# Patient Record
Sex: Male | Born: 1965 | Hispanic: No | Marital: Married | Smoking: Never smoker
Health system: Southern US, Community
[De-identification: ages and names within clinical notes are randomized; demographics above are authoritative.]

## PROBLEM LIST (undated history)

## (undated) DIAGNOSIS — Z972 Presence of dental prosthetic device (complete) (partial): Secondary | ICD-10-CM

## (undated) DIAGNOSIS — G4733 Obstructive sleep apnea (adult) (pediatric): Secondary | ICD-10-CM

## (undated) DIAGNOSIS — N200 Calculus of kidney: Secondary | ICD-10-CM

## (undated) DIAGNOSIS — M1990 Unspecified osteoarthritis, unspecified site: Secondary | ICD-10-CM

## (undated) DIAGNOSIS — G4730 Sleep apnea, unspecified: Secondary | ICD-10-CM

## (undated) DIAGNOSIS — N201 Calculus of ureter: Secondary | ICD-10-CM

## (undated) DIAGNOSIS — R351 Nocturia: Secondary | ICD-10-CM

## (undated) DIAGNOSIS — Z9989 Dependence on other enabling machines and devices: Secondary | ICD-10-CM

## (undated) HISTORY — PX: KNEE ARTHROSCOPY: SUR90

## (undated) HISTORY — PX: WISDOM TOOTH EXTRACTION: SHX21

## (undated) HISTORY — DX: Sleep apnea, unspecified: G47.30

---

## 2010-04-09 ENCOUNTER — Ambulatory Visit: Payer: Self-pay | Admitting: Internal Medicine

## 2010-04-09 DIAGNOSIS — E66811 Obesity, class 1: Secondary | ICD-10-CM | POA: Insufficient documentation

## 2010-04-09 DIAGNOSIS — E669 Obesity, unspecified: Secondary | ICD-10-CM

## 2010-05-30 NOTE — Assessment & Plan Note (Signed)
Summary: NEW PT TO EST/CPX/CLE   Vital Signs:  Patient profile:   45 year old male Height:      69 inches Weight:      192.25 pounds BMI:     28.49 Temp:     98.2 degrees F oral Pulse rate:   76 / minute Pulse rhythm:   regular BP sitting:   110 / 80  (left arm) Cuff size:   regular  Vitals Entered By: Selena Batten Dance CMA Duncan Dull) (April 09, 2010 10:22 AM) CC: New patient to establish care, C-V Risk Management   History of Present Illness: CC: new pt, requests CPE  wife [redacted]wks pregnant.  weight - walks dogs.  has actually lost 30 lbs since moved here from [place] 05/2009.  preventative: flu shot, declines tetanus shot - 1997, Tdap requests today.   health fair at work 01/2010. prostate - nocturia x 1 maybe, strong stream colon - no BM changes, no blood in stool.  Cardiovascular Risk History:      Positive major cardiovascular risk factors include family history for ischemic heart disease (males less than 6 years old).  Negative major cardiovascular risk factors include male age less than 32 years old and non-tobacco-user status.        Further assessment for target organ damage reveals no history of ASHD, cardiac end-organ damage (CHF/LVH), stroke/TIA, peripheral vascular disease, or hypertensive retinopathy.     Preventive Screening-Counseling & Management  Alcohol-Tobacco     Smoking Status: never  Current Medications (verified): 1)  Multivitamins  Tabs (Multiple Vitamin) .... One Daily  Allergies (verified): No Known Drug Allergies  Past History:  Past Medical History: BMI 28  Past Surgical History: L knee arthroscopy 1993  Family History: F: D CAD/MI 43yo   No CA, CVA, DM, HTN  Social History: No smoking, occasional EtOH, no rec drugs caffeine: 1 cup tea/day Occupation: hose fabrication Lives with wife, 2 dogs some collegeSmoking Status:  never  Review of Systems  The patient denies anorexia, fever, weight loss, weight gain, vision loss, decreased  hearing, hoarseness, chest pain, syncope, dyspnea on exertion, peripheral edema, prolonged cough, headaches, hemoptysis, abdominal pain, melena, hematochezia, severe indigestion/heartburn, hematuria, incontinence, muscle weakness, difficulty walking, depression, unusual weight change, and testicular masses.    Physical Exam  General:  Well-developed,well-nourished,in no acute distress; alert,appropriate and cooperative throughout examination Head:  Normocephalic and atraumatic without obvious abnormalities. No apparent alopecia or balding. Eyes:  No corneal or conjunctival inflammation noted. EOMI. Perrla.  Ears:  TMs clear bilaterally Nose:  nares clear bilaterally Mouth:  MMM, no pharyngeal erythema, fair dentition Neck:  No deformities, masses, or tenderness noted.  no LAD, no thyromegaly Lungs:  Normal respiratory effort, chest expands symmetrically. Lungs are clear to auscultation, no crackles or wheezes. Heart:  Normal rate and regular rhythm. S1 and S2 normal without gallop, murmur, click, rub or other extra sounds. Abdomen:  Bowel sounds positive,abdomen soft and non-tender without masses, organomegaly or hernias noted. Msk:  No deformity or scoliosis noted of thoracic or lumbar spine.   Pulses:  2+ rad pulses Extremities:  no pedal edema Neurologic:  CN grossly intact, station and gait intact Skin:  Intact without suspicious lesions or rashes Psych:  full affect, pleasant.   Impression & Recommendations:  Problem # 1:  HEALTH MAINTENANCE EXAM (ICD-V70.0) Tdap today.  Reviewed preventive care protocols, scheduled due services, and updated immunizations.  to bring in recent blood work, will see if Id like to add anything, likely obtain FLP,  CMP.  Problem # 2:  OVERWEIGHT (ICD-278.02) check recent blood work at work, then return for further if indicated.   Ht: 69 (04/09/2010)   Wt: 192.25 (04/09/2010)   BMI: 28.49 (04/09/2010)  Complete Medication List: 1)  Multivitamins Tabs  (Multiple vitamin) .... One daily  Other Orders: Tdap => 13yrs IM (46962) Admin 1st Vaccine (95284)  Cardiovascular Risk Assessment/Plan:      The patient's hypertensive risk group is category B: At least one risk factor (excluding diabetes) with no target organ damage.  Today's blood pressure is 110/80.    Patient Instructions: 1)  Tdap today. 2)  Return in 1 year for next physical or as needed. 3)  Bring copy of recent blood work. 4)  Good to meet you today, call clinic with questions.   Orders Added: 1)  Tdap => 24yrs IM [90715] 2)  Admin 1st Vaccine [[id number]] 3)  New Patient 40-64 years [99386]   Immunizations Administered:  Tetanus Vaccine:    Vaccine Type: Tdap    Site: left deltoid    Mfr: GlaxoSmithKline    Dose: 0.5 ml    Route: IM    Given by: Selena Batten Dance CMA (AAMA)    Exp. Date: 02/15/2012    Lot #: [id number]    VIS given: 03/15/08 version given April 09, 2010.   Immunizations Administered:  Tetanus Vaccine:    Vaccine Type: Tdap    Site: left deltoid    Mfr: GlaxoSmithKline    Dose: 0.5 ml    Route: IM    Given by: Selena Batten Dance CMA (AAMA)    Exp. Date: 02/15/2012    Lot #: [id number]    VIS given: 03/15/08 version given April 09, 2010.  Prior Medications: Current Allergies (reviewed today): No known allergies

## 2011-05-27 ENCOUNTER — Ambulatory Visit (INDEPENDENT_AMBULATORY_CARE_PROVIDER_SITE_OTHER): Payer: BC Managed Care – PPO | Admitting: Family Medicine

## 2011-05-27 ENCOUNTER — Encounter: Payer: Self-pay | Admitting: Family Medicine

## 2011-05-27 VITALS — BP 110/80 | HR 80 | Temp 97.9°F | Ht 69.0 in | Wt 221.8 lb

## 2011-05-27 DIAGNOSIS — Z0000 Encounter for general adult medical examination without abnormal findings: Secondary | ICD-10-CM | POA: Insufficient documentation

## 2011-05-27 DIAGNOSIS — R21 Rash and other nonspecific skin eruption: Secondary | ICD-10-CM

## 2011-05-27 DIAGNOSIS — Z23 Encounter for immunization: Secondary | ICD-10-CM

## 2011-05-27 LAB — LIPID PANEL
HDL: 49.8 mg/dL (ref >39.00)
LDL Cholesterol: 125 mg/dL — ABNORMAL HIGH (ref 0–99)
Total CHOL/HDL Ratio: 4
Triglycerides: 87 mg/dL (ref 0.0–149.0)

## 2011-05-27 LAB — COMPREHENSIVE METABOLIC PANEL
ALT: 22 U/L (ref 0–53)
BUN: 20 mg/dL (ref 6–23)
CO2: 25 mEq/L (ref 19–32)
Creatinine, Ser: 0.8 mg/dL (ref 0.4–1.5)
GFR: 118.74 mL/min (ref >60.00)
Total Bilirubin: 0.7 mg/dL (ref 0.3–1.2)

## 2011-05-27 NOTE — Progress Notes (Signed)
Subjective:    Patient ID: Dwayne Delgado, male    DOB: 07/15/1965, 46 y.o.   MRN: [id number]  HPI CC: CPE today  Fasting, would like blood work.  Rash at work - bilateral forearms.  Itchy, wonders if due to dust.    Preventative: Blood work today flu shot, would like today. tetanus shot - 03/2010 prostate - nocturia x 1 maybe, strong stream colon - no BM changes, no blood in stool Seat belt 100% Sunscreen use discussed  1 cup tea/daily Hose fabrication, 3rd shift Lives with wife, 6 mo baby, 2 dogs Some college Activity: walks with dogs Diet: good water, daily fruits/vegetables, 2-3x/wk red meat, fish seldom  Medications and allergies reviewed and updated in chart.  Past histories reviewed and updated if relevant as below. Patient Active Problem List  Diagnoses  . OVERWEIGHT   Past Medical History  Diagnosis Date  . Overweight    Past Surgical History  Procedure Date  . Knee arthroscopy 1993    Left   History  Substance Use Topics  . Smoking status: Never Smoker   . Smokeless tobacco: Never Used  . Alcohol Use: Yes     Very rare   Family History  Problem Relation Age of Onset  . Coronary artery disease Father   . Heart attack Father 33  . Cancer Neg Hx   . Stroke Neg Hx   . Diabetes Neg Hx   . Hypertension Neg Hx    No Known Allergies No current outpatient prescriptions on file prior to visit.     Review of Systems  Constitutional: Negative for fever, chills, activity change, appetite change, fatigue and unexpected weight change.  HENT: Negative for hearing loss and neck pain.   Eyes: Positive for visual disturbance (to see optometrist today).  Respiratory: Negative for cough, chest tightness, shortness of breath and wheezing.   Cardiovascular: Negative for chest pain, palpitations and leg swelling.  Gastrointestinal: Negative for nausea, vomiting, abdominal pain, diarrhea, constipation, blood in stool and abdominal distention.  Genitourinary:  Negative for hematuria and difficulty urinating.  Musculoskeletal: Negative for myalgias and arthralgias.  Skin: Negative for rash.  Neurological: Negative for dizziness, seizures, syncope and headaches.  Hematological: Does not bruise/bleed easily.  Psychiatric/Behavioral: Negative for dysphoric mood. The patient is not nervous/anxious.        Objective:   Physical Exam  Nursing note and vitals reviewed. Constitutional: He is oriented to person, place, and time. He appears well-developed and well-nourished. No distress.  HENT:  Head: Normocephalic and atraumatic.  Right Ear: External ear normal.  Left Ear: External ear normal.  Nose: Nose normal.  Mouth/Throat: Oropharynx is clear and moist. No oropharyngeal exudate.  Eyes: Conjunctivae and EOM are normal. Pupils are equal, round, and reactive to light. No scleral icterus.  Neck: Normal range of motion. Neck supple. No thyromegaly present.  Cardiovascular: Normal rate, regular rhythm, normal heart sounds and intact distal pulses.   No murmur Delgado. Pulses:      Radial pulses are 2+ on the right side, and 2+ on the left side.  Pulmonary/Chest: Effort normal and breath sounds normal. No respiratory distress. He has no wheezes. He has no rales.  Abdominal: Soft. Bowel sounds are normal. He exhibits no distension and no mass. There is no tenderness. There is no rebound and no guarding.  Musculoskeletal: Normal range of motion.  Lymphadenopathy:    He has no cervical adenopathy.  Neurological: He is alert and oriented to person, place, and time.  CN grossly intact, station and gait intact  Skin: Skin is warm and dry. Rash noted.       Mild papular rash anterior distal forearms  Psychiatric: He has a normal mood and affect. His behavior is normal. Judgment and thought content normal.       Assessment & Plan:

## 2011-05-27 NOTE — Patient Instructions (Signed)
Flu shot today. Blood work today. For skin rash - looks like irritant dermatitis from some exposure at work.  Consider longer gloves, may try hydrocortisone cream over the counter or anti itch cream over the counter.  Let us know if worsening. Good to see you today, call us with quesitons. Return as needed or in 1 year for another physical.

## 2011-05-27 NOTE — Assessment & Plan Note (Signed)
Anticipate irritant dermatitis. See pt instructions. May try OTC hydrocortisone cream, advised limit use to 1wk at a time.

## 2011-05-27 NOTE — Assessment & Plan Note (Signed)
Preventative protocols reviewed and updated unless pt declined. Flu shot today. Discussed healthy diet and lifestyle.

## 2014-02-10 ENCOUNTER — Ambulatory Visit (INDEPENDENT_AMBULATORY_CARE_PROVIDER_SITE_OTHER): Payer: BC Managed Care – PPO

## 2014-02-10 DIAGNOSIS — Z23 Encounter for immunization: Secondary | ICD-10-CM

## 2014-06-13 ENCOUNTER — Ambulatory Visit (INDEPENDENT_AMBULATORY_CARE_PROVIDER_SITE_OTHER)
Admission: RE | Admit: 2014-06-13 | Discharge: 2014-06-13 | Disposition: A | Discharge status: Home / Self Care | Payer: BLUE CROSS/BLUE SHIELD | Source: Ambulatory Visit | Attending: Family Medicine | Admitting: Family Medicine

## 2014-06-13 ENCOUNTER — Encounter: Payer: Self-pay | Admitting: Family Medicine

## 2014-06-13 ENCOUNTER — Ambulatory Visit (INDEPENDENT_AMBULATORY_CARE_PROVIDER_SITE_OTHER): Payer: BLUE CROSS/BLUE SHIELD | Admitting: Family Medicine

## 2014-06-13 VITALS — BP 138/84 | HR 104 | Temp 98.2°F | Ht 68.5 in | Wt 219.5 lb

## 2014-06-13 DIAGNOSIS — J181 Lobar pneumonia, unspecified organism: Principal | ICD-10-CM

## 2014-06-13 DIAGNOSIS — R351 Nocturia: Secondary | ICD-10-CM | POA: Insufficient documentation

## 2014-06-13 DIAGNOSIS — J189 Pneumonia, unspecified organism: Secondary | ICD-10-CM | POA: Insufficient documentation

## 2014-06-13 DIAGNOSIS — E669 Obesity, unspecified: Secondary | ICD-10-CM

## 2014-06-13 MED ORDER — TAMSULOSIN HCL 0.4 MG PO CAPS: 0.4000 mg | ORAL_CAPSULE | Freq: Every day | ORAL | Status: DC

## 2014-06-13 MED ORDER — AZITHROMYCIN 250 MG PO TABS: ORAL_TABLET | ORAL | Status: DC

## 2014-06-13 MED ORDER — CEFTRIAXONE SODIUM 1 G IJ SOLR
1.0000 g | Freq: Once | INTRAMUSCULAR | Status: AC
  Administered 2014-06-13: 1 g via INTRAMUSCULAR

## 2014-06-13 MED ORDER — ONDANSETRON HCL 4 MG PO TABS: 4.0000 mg | ORAL_TABLET | Freq: Three times a day (TID) | ORAL | Status: DC | PRN

## 2014-06-13 NOTE — Assessment & Plan Note (Signed)
Anticipate BPH related - will start flomax 0.4mg  daily today, discussed need to monitor for hypotension with med.  Pt agrees with plan.  Check UA next visit.

## 2014-06-13 NOTE — Addendum Note (Signed)
Addended by: Royann Shivers A on: 06/13/2014 04:30 PM   Modules accepted: Orders

## 2014-06-13 NOTE — Assessment & Plan Note (Signed)
Check labs today - review next visit.

## 2014-06-13 NOTE — Progress Notes (Signed)
BP 138/84 mmHg  Pulse 104  Temp(Src) 98.2 F (36.8 C) (Oral)  Ht 5' 8.5" (1.74 m)  Wt 219 lb 8 oz (99.565 kg)  BMI 32.89 kg/m2  SpO2 93%   CC: CPE converted to acute visit Subjective:    Patient ID: Dwayne Delgado, male    DOB: February 26, 1966, 49 y.o.   MRN: [id number]  HPI: Dwayne Delgado is a 49 y.o. male presenting on 06/13/2014 for Annual Exam   Last seen here 05/27/2011. Scheduled for CPE but converted to sick visit.  Cough present for last 2 weeks, to point of gagging. Cough productive of green mucous. Occasional chills and dyspnea and headaches with cough.  Last week with fever to 101, none since. Nausea/vomiting over last 3-4 days., unable to keep food down.  13 lbs down in last week. No fevers, ear or tooth pain, abd pain, ST or PNDrainage. Staying well hydrated with plenty of water. So far tried mucinex and tylenol OTC, and robitussin. No sick contacts at home. No smokers at home.   [facility]. Started on allopurinol 100mg  daily. Waiting for refill.   Noticing increased nocturia 5-7x, ongoing for last 6 months. Still strong stream, without hesitancy or dribbling, no dysuria, urgency or hematuria.   Fasting today.  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 Interim medical history since our last visit reviewed. Allergies and medications reviewed and updated. Current Outpatient Prescriptions on File Prior to Visit  Medication Sig  . Multiple Vitamins-Minerals (MULTIVITAMIN PO) Take 1  tablet by mouth daily.   No current facility-administered medications on file prior to visit.    Review of Systems  Constitutional: Positive for appetite change and unexpected weight change. Negative for fever, chills, activity change and fatigue.  HENT: Negative for hearing loss.   Eyes: Negative for visual disturbance.  Respiratory: Positive for cough and shortness of breath. Negative for chest tightness and wheezing.   Cardiovascular: Negative for chest pain, palpitations and leg swelling.  Gastrointestinal: Positive for nausea, vomiting and abdominal pain. Negative for diarrhea, constipation, blood in stool and abdominal distention.  Genitourinary: Negative for hematuria and difficulty urinating.  Musculoskeletal: Negative for myalgias, arthralgias and neck pain.  Skin: Negative for rash.  Neurological: Positive for headaches. Negative for dizziness, seizures and syncope.  Hematological: Negative for adenopathy. Does not bruise/bleed easily.  Psychiatric/Behavioral: Negative for dysphoric mood. The patient is not nervous/anxious.    Per HPI unless specifically indicated above     Objective:    BP 138/84 mmHg  Pulse 104  Temp(Src) 98.2 F (36.8 C) (Oral)  Ht 5' 8.5" (1.74 m)  Wt 219 lb 8 oz (99.565 kg)  BMI 32.89 kg/m2  SpO2 93%  Wt Readings from Last 3 Encounters:  06/13/14 219 lb 8 oz (99.565 kg)  05/27/11 221 lb 12 oz (100.585 kg)  04/09/10 192 lb 4 oz (87.204 kg)    Physical Exam  Constitutional: He is oriented to person, place, and time. He appears well-developed. No distress.  Tired, ill appearing, nontoxic  HENT:  Head: Normocephalic and atraumatic.  Right Ear: Hearing, tympanic membrane, external ear and ear canal normal.  Left Ear: Hearing, tympanic membrane, external ear and ear canal normal.  Nose: Nose normal.  Mouth/Throat: Uvula is midline, oropharynx is clear and moist and mucous membranes are normal. No oropharyngeal exudate, posterior oropharyngeal  edema or posterior oropharyngeal erythema.  Eyes: Conjunctivae and EOM are normal. Pupils are equal, round, and reactive to light. No scleral icterus.  Neck: Normal range of motion. Neck supple. No thyromegaly present.  Cardiovascular: Normal rate, regular rhythm, normal heart sounds and intact distal pulses.   No murmur heard. Pulses:      Radial pulses are 2+ on the right side, and 2+ on the left side.  Pulmonary/Chest: Effort normal. No respiratory distress. He has decreased breath sounds (RLL and LLL). He has no wheezes. He has no rhonchi. He has rales (RLL > LLL).  Abdominal: Soft. Bowel sounds are normal. He exhibits no distension and no mass. There is no tenderness. There is no rebound and no guarding.  Genitourinary: Rectum normal. Rectal exam shows no external hemorrhoid, no internal hemorrhoid, no fissure, no mass, no tenderness and anal tone normal. Prostate is enlarged (20-25gm). Prostate is not tender.  Musculoskeletal: Normal range of motion. He exhibits no edema.  Lymphadenopathy:    He has no cervical adenopathy.  Neurological: He is alert and oriented to person, place, and time.  CN grossly intact, station and gait intact  Skin: Skin is warm and dry. No rash noted.  Psychiatric: He has a normal mood and affect. His behavior is normal. Judgment and thought content normal.  Nursing note and vitals reviewed.      Assessment & Plan:   Problem List Items Addressed This Visit    RLL pneumonia - Primary    Clinical RLL PNA but LLL infiltrate on xray on my read. Await radiology read. Treat with 1gm CTX IM today as well as zpack sent to pharmacy. zofran prn nausea.  Update or return to clinic if any worsening/not improving as expected otherwise.  Pt agrees with plan.      Relevant Medications   azithromycin (ZITHROMAX) tablet   Other Relevant Orders   DG Chest 2 View   CBC with Differential/Platelet   Obesity    Check labs today - review next visit.      Relevant  Orders   Comprehensive metabolic panel   Lipid panel   Nocturia    Anticipate BPH related - will start flomax 0.4mg  daily today, discussed need to monitor for hypotension with med.  Pt agrees with plan.  Check UA next visit.       Relevant Orders   PSA       Follow up plan: Return in about 4 weeks (around 07/11/2014), or as needed, for annual exam, prior fasting for blood work.

## 2014-06-13 NOTE — Assessment & Plan Note (Signed)
Clinical RLL PNA but LLL infiltrate on xray on my read. Await radiology read. Treat with 1gm CTX IM today as well as zpack sent to pharmacy. zofran prn nausea.  Update or return to clinic if any worsening/not improving as expected otherwise.  Pt agrees with plan.

## 2014-06-13 NOTE — Progress Notes (Signed)
Pre visit review using our clinic review tool, if applicable. No additional management support is needed unless otherwise documented below in the visit note. 

## 2014-06-13 NOTE — Patient Instructions (Addendum)
I think you have pneumonia - xray today. Shot of antibiotic today. Then start azithromycin sent to pharmacy. Push fluids and rest. For prostate - try flomax once daily to improve voiding. Watch for dizziness or low blood pressure with this medicine. Return in 1 month for annual physical. Labwork today and we will call you with results.

## 2014-06-14 LAB — LIPID PANEL
CHOL/HDL RATIO: 5
Cholesterol: 166 mg/dL (ref 0–200)
HDL: 34.1 mg/dL — AB (ref >39.00)
LDL Cholesterol: 118 mg/dL — ABNORMAL HIGH (ref 0–99)
NONHDL: 131.9
Triglycerides: 70 mg/dL (ref 0.0–149.0)
VLDL: 14 mg/dL (ref 0.0–40.0)

## 2014-06-14 LAB — COMPREHENSIVE METABOLIC PANEL
ALT: 28 U/L (ref 0–53)
AST: 27 U/L (ref 0–37)
Albumin: 4 g/dL (ref 3.5–5.2)
Alkaline Phosphatase: 89 U/L (ref 39–117)
BILIRUBIN TOTAL: 0.6 mg/dL (ref 0.2–1.2)
BUN: 16 mg/dL (ref 6–23)
CALCIUM: 10 mg/dL (ref 8.4–10.5)
CHLORIDE: 99 meq/L (ref 96–112)
CO2: 26 meq/L (ref 19–32)
CREATININE: 0.92 mg/dL (ref 0.40–1.50)
GFR: 93.94 mL/min (ref >60.00)
Glucose, Bld: 95 mg/dL (ref 70–99)
Potassium: 3.9 mEq/L (ref 3.5–5.1)
Sodium: 138 mEq/L (ref 135–145)
Total Protein: 7.4 g/dL (ref 6.0–8.3)

## 2014-06-14 LAB — CBC WITH DIFFERENTIAL/PLATELET
Basophils Absolute: 0.1 10*3/uL (ref 0.0–0.1)
Basophils Relative: 0.4 % (ref 0.0–3.0)
EOS PCT: 0.2 % (ref 0.0–5.0)
Eosinophils Absolute: 0 10*3/uL (ref 0.0–0.7)
HCT: 43.2 % (ref 39.0–52.0)
Hemoglobin: 14.4 g/dL (ref 13.0–17.0)
LYMPHS PCT: 13.6 % (ref 12.0–46.0)
Lymphs Abs: 2.2 10*3/uL (ref 0.7–4.0)
MCHC: 33.2 g/dL (ref 30.0–36.0)
MCV: 81.6 fl (ref 78.0–100.0)
MONO ABS: 0.7 10*3/uL (ref 0.1–1.0)
Monocytes Relative: 4.3 % (ref 3.0–12.0)
NEUTROS PCT: 81.5 % — AB (ref 43.0–77.0)
Neutro Abs: 13.3 10*3/uL — ABNORMAL HIGH (ref 1.4–7.7)
PLATELETS: 594 10*3/uL — AB (ref 150.0–400.0)
RBC: 5.29 Mil/uL (ref 4.22–5.81)
RDW: 13.1 % (ref 11.5–15.5)
WBC: 16.4 10*3/uL — AB (ref 4.0–10.5)

## 2014-06-14 LAB — PSA: PSA: 0.45 ng/mL (ref 0.10–4.00)

## 2014-06-16 ENCOUNTER — Encounter: Payer: Self-pay | Admitting: *Deleted

## 2014-07-10 ENCOUNTER — Other Ambulatory Visit: Payer: Self-pay | Admitting: *Deleted

## 2014-07-10 MED ORDER — TAMSULOSIN HCL 0.4 MG PO CAPS: 0.4000 mg | ORAL_CAPSULE | Freq: Every day | ORAL | Status: DC

## 2014-07-14 ENCOUNTER — Encounter: Payer: BLUE CROSS/BLUE SHIELD | Admitting: Family Medicine

## 2014-10-13 ENCOUNTER — Ambulatory Visit (INDEPENDENT_AMBULATORY_CARE_PROVIDER_SITE_OTHER): Payer: BLUE CROSS/BLUE SHIELD | Admitting: Family Medicine

## 2014-10-13 ENCOUNTER — Encounter: Payer: Self-pay | Admitting: Family Medicine

## 2014-10-13 VITALS — BP 126/74 | HR 82 | Temp 97.8°F | Ht 68.0 in | Wt 216.8 lb

## 2014-10-13 DIAGNOSIS — R351 Nocturia: Secondary | ICD-10-CM

## 2014-10-13 DIAGNOSIS — Z0000 Encounter for general adult medical examination without abnormal findings: Secondary | ICD-10-CM

## 2014-10-13 DIAGNOSIS — E669 Obesity, unspecified: Secondary | ICD-10-CM

## 2014-10-13 LAB — POCT URINALYSIS DIPSTICK
BILIRUBIN UA: NEGATIVE
Blood, UA: NEGATIVE
GLUCOSE UA: NEGATIVE
Ketones, UA: NEGATIVE
Leukocytes, UA: NEGATIVE
NITRITE UA: NEGATIVE
PH UA: 6
Protein, UA: NEGATIVE
SPEC GRAV UA: 1.025
UROBILINOGEN UA: 0.2

## 2014-10-13 MED ORDER — DOXAZOSIN MESYLATE 2 MG PO TABS: 2.0000 mg | ORAL_TABLET | Freq: Every day | ORAL | Status: DC

## 2014-10-13 NOTE — Assessment & Plan Note (Signed)
DRE last year with mildly enlarged prostate, but did not respond well to flomax. Will check UA today and trial cardura. If fails this, try oxybutynin IR nightly.

## 2014-10-13 NOTE — Assessment & Plan Note (Signed)
Preventative protocols reviewed and updated unless pt declined. Discussed healthy diet and lifestyle.  

## 2014-10-13 NOTE — Patient Instructions (Addendum)
You are doing well today. Good to see you, call us with questions. Check urine today. If normal we will send in different prostate medicine to help with night time voiding. Return as needed or in 1 year for next physical.

## 2014-10-13 NOTE — Progress Notes (Signed)
BP 126/74 mmHg  Pulse 82  Temp(Src) 97.8 F (36.6 C) (Oral)  Ht 5\' 8"  (1.727 m)  Wt 216 lb 12.8 oz (98.34 kg)  BMI 32.97 kg/m2  SpO2 95%   CC: CPE  Subjective:    Patient ID: Dwayne Delgado, male    DOB: 1966/03/08, 49 y.o.   MRN: [id number]  HPI: Dwayne Delgado is a 49 y.o. male presenting on 10/13/2014 for Annual Exam   Nocturia x3-6 nightly. Flomax didn't help. No dysuria or UTI sxs.   Preventative: Prostate - nocturia x 3-6, strong stream Colon - no BM changes, no blood in stool flu shot 01/2014 tetanus shot - 03/2010 Seat belt 100% Sunscreen use discussed, no changing moles  1 cup tea/daily Lives with wife, 2 children, 2 dogs Occ: Hose fabrication, 1st shift Edu: Some college Activity: walks with dogs and walks regularly at work Diet: good water, daily fruits/vegetables  Relevant past medical, surgical, family and social history reviewed and updated as indicated. Interim medical history since our last visit reviewed. Allergies and medications reviewed and updated. Current Outpatient Prescriptions on File Prior to Visit  Medication Sig  . Multiple Vitamins-Minerals (MULTIVITAMIN PO) Take 1 tablet by mouth daily.   No current facility-administered medications on file prior to visit.    Review of Systems  Constitutional: Negative for fever, chills, activity change, appetite change, fatigue and unexpected weight change.  HENT: Negative for hearing loss.   Eyes: Negative for visual disturbance.  Respiratory: Negative for cough, chest tightness, shortness of breath and wheezing.   Cardiovascular: Negative for chest pain, palpitations and leg swelling.  Gastrointestinal: Negative for nausea, vomiting, abdominal pain, diarrhea, constipation, blood in stool and abdominal distention.  Genitourinary: Negative for hematuria and difficulty urinating.  Musculoskeletal: Positive for joint swelling (knees) and arthralgias (knees). Negative for myalgias and neck pain.  Skin:  Negative for rash.  Neurological: Negative for dizziness, seizures, syncope and headaches.  Hematological: Negative for adenopathy. Does not bruise/bleed easily.  Psychiatric/Behavioral: Negative for dysphoric mood. The patient is not nervous/anxious.    Per HPI unless specifically indicated above     Objective:    BP 126/74 mmHg  Pulse 82  Temp(Src) 97.8 F (36.6 C) (Oral)  Ht 5\' 8"  (1.727 m)  Wt 216 lb 12.8 oz (98.34 kg)  BMI 32.97 kg/m2  SpO2 95%  Wt Readings from Last 3 Encounters:  10/13/14 216 lb 12.8 oz (98.34 kg)  06/13/14 219 lb 8 oz (99.565 kg)  05/27/11 221 lb 12 oz (100.585 kg)    Physical Exam  Constitutional: He is oriented to person, place, and time. He appears well-developed and well-nourished. No distress.  HENT:  Head: Normocephalic and atraumatic.  Right Ear: Hearing, tympanic membrane, external ear and ear canal normal.  Left Ear: Hearing, tympanic membrane, external ear and ear canal normal.  Nose: Nose normal.  Mouth/Throat: Uvula is midline, oropharynx is clear and moist and mucous membranes are normal. No oropharyngeal exudate, posterior oropharyngeal edema or posterior oropharyngeal erythema.  Eyes: Conjunctivae and EOM are normal. Pupils are equal, round, and reactive to light. No scleral icterus.  Neck: Normal range of motion. Neck supple. No thyromegaly present.  Cardiovascular: Normal rate, regular rhythm, normal heart sounds and intact distal pulses.   No murmur heard. Pulses:      Radial pulses are 2+ on the right side, and 2+ on the left side.  Pulmonary/Chest: Effort normal and breath sounds normal. No respiratory distress. He has no wheezes. He has no rales.  Abdominal: Soft. Bowel sounds are normal. He exhibits no distension and no mass. There is no tenderness. There is no rebound and no guarding.  Genitourinary:  DRE - deferred - done 05/2014.  Musculoskeletal: Normal range of motion. He exhibits no edema.  Lymphadenopathy:    He has no  cervical adenopathy.  Neurological: He is alert and oriented to person, place, and time.  CN grossly intact, station and gait intact  Skin: Skin is warm and dry. No rash noted.  Psychiatric: He has a normal mood and affect. His behavior is normal. Judgment and thought content normal.  Nursing note and vitals reviewed.  Results for orders placed or performed in visit on 06/13/14  PSA  Result Value Ref Range   PSA 0.45 0.10 - 4.00 ng/mL  CBC with Differential/Platelet  Result Value Ref Range   WBC 16.4 (H) 4.0 - 10.5 K/uL   RBC 5.29 4.22 - 5.81 Mil/uL   Hemoglobin 14.4 13.0 - 17.0 g/dL   HCT 43.2 39.0 - 52.0 %   MCV 81.6 78.0 - 100.0 fl   MCHC 33.2 30.0 - 36.0 g/dL   RDW 13.1 11.5 - 15.5 %   Platelets 594.0 (H) 150.0 - 400.0 K/uL   Neutrophils Relative % 81.5 (H) 43.0 - 77.0 %   Lymphocytes Relative 13.6 12.0 - 46.0 %   Monocytes Relative 4.3 3.0 - 12.0 %   Eosinophils Relative 0.2 0.0 - 5.0 %   Basophils Relative 0.4 0.0 - 3.0 %   Neutro Abs 13.3 (H) 1.4 - 7.7 K/uL   Lymphs Abs 2.2 0.7 - 4.0 K/uL   Monocytes Absolute 0.7 0.1 - 1.0 K/uL   Eosinophils Absolute 0.0 0.0 - 0.7 K/uL   Basophils Absolute 0.1 0.0 - 0.1 K/uL  Comprehensive metabolic panel  Result Value Ref Range   Sodium 138 135 - 145 mEq/L   Potassium 3.9 3.5 - 5.1 mEq/L   Chloride 99 96 - 112 mEq/L   CO2 26 19 - 32 mEq/L   Glucose, Bld 95 70 - 99 mg/dL   BUN 16 6 - 23 mg/dL   Creatinine, Ser 0.92 0.40 - 1.50 mg/dL   Total Bilirubin 0.6 0.2 - 1.2 mg/dL   Alkaline Phosphatase 89 39 - 117 U/L   AST 27 0 - 37 U/L   ALT 28 0 - 53 U/L   Total Protein 7.4 6.0 - 8.3 g/dL   Albumin 4.0 3.5 - 5.2 g/dL   Calcium 10.0 8.4 - 10.5 mg/dL   GFR 93.94 >60.00 mL/min  Lipid panel  Result Value Ref Range   Cholesterol 166 0 - 200 mg/dL   Triglycerides 70.0 0.0 - 149.0 mg/dL   HDL 34.10 (L) >39.00 mg/dL   VLDL 14.0 0.0 - 40.0 mg/dL   LDL Cholesterol 118 (H) 0 - 99 mg/dL   Total CHOL/HDL Ratio 5    NonHDL 131.90         Assessment & Plan:   Problem List Items Addressed This Visit    Healthcare maintenance - Primary    Preventative protocols reviewed and updated unless pt declined. Discussed healthy diet and lifestyle.       Nocturia    DRE last year with mildly enlarged prostate, but did not respond well to flomax. Will check UA today and trial cardura. If fails this, try oxybutynin IR nightly.      Obesity, Class I, BMI 30-34.9    Discussed healthy diet and lifestyle changes to affect sustainable weight loss.  Follow up plan: Return in about 1 year (around 10/13/2015), or as needed, for annual exam, prior fasting for blood work.

## 2014-10-13 NOTE — Progress Notes (Signed)
Pre visit review using our clinic review tool, if applicable. No additional management support is needed unless otherwise documented below in the visit note. 

## 2014-10-13 NOTE — Addendum Note (Signed)
Addended by: [place] on: 10/13/2014 03:20 PM   Modules accepted: Orders

## 2014-10-13 NOTE — Assessment & Plan Note (Signed)
Discussed healthy diet and lifestyle changes to affect sustainable weight loss  

## 2014-10-17 ENCOUNTER — Encounter: Payer: Self-pay | Admitting: *Deleted

## 2015-07-10 ENCOUNTER — Other Ambulatory Visit: Payer: Self-pay | Admitting: Orthopedic Surgery

## 2015-07-10 DIAGNOSIS — M1712 Unilateral primary osteoarthritis, left knee: Secondary | ICD-10-CM

## 2015-07-17 ENCOUNTER — Ambulatory Visit
Admission: RE | Admit: 2015-07-17 | Discharge: 2015-07-17 | Disposition: A | Discharge status: Home / Self Care | Payer: No Typology Code available for payment source | Source: Ambulatory Visit | Attending: Orthopedic Surgery | Admitting: Orthopedic Surgery

## 2015-07-17 DIAGNOSIS — M1712 Unilateral primary osteoarthritis, left knee: Secondary | ICD-10-CM

## 2015-08-03 ENCOUNTER — Other Ambulatory Visit: Payer: Self-pay | Admitting: Orthopedic Surgery

## 2015-08-16 ENCOUNTER — Encounter (HOSPITAL_COMMUNITY)
Admission: RE | Admit: 2015-08-16 | Discharge: 2015-08-16 | Disposition: A | Discharge status: Home / Self Care | Payer: No Typology Code available for payment source | Source: Ambulatory Visit | Attending: Orthopedic Surgery | Admitting: Orthopedic Surgery

## 2015-08-16 ENCOUNTER — Encounter (HOSPITAL_COMMUNITY): Payer: Self-pay

## 2015-08-16 DIAGNOSIS — Z01812 Encounter for preprocedural laboratory examination: Secondary | ICD-10-CM | POA: Insufficient documentation

## 2015-08-16 DIAGNOSIS — M1712 Unilateral primary osteoarthritis, left knee: Secondary | ICD-10-CM | POA: Insufficient documentation

## 2015-08-16 DIAGNOSIS — Z0183 Encounter for blood typing: Secondary | ICD-10-CM | POA: Diagnosis not present

## 2015-08-16 LAB — COMPREHENSIVE METABOLIC PANEL
ALT: 20 U/L (ref 17–63)
ANION GAP: 11 (ref 5–15)
AST: 24 U/L (ref 15–41)
Albumin: 4 g/dL (ref 3.5–5.0)
Alkaline Phosphatase: 89 U/L (ref 38–126)
BUN: 17 mg/dL (ref 6–20)
CHLORIDE: 109 mmol/L (ref 101–111)
CO2: 22 mmol/L (ref 22–32)
Calcium: 9.5 mg/dL (ref 8.9–10.3)
Creatinine, Ser: 1.04 mg/dL (ref 0.61–1.24)
Glucose, Bld: 111 mg/dL — ABNORMAL HIGH (ref 65–99)
Potassium: 3.8 mmol/L (ref 3.5–5.1)
SODIUM: 142 mmol/L (ref 135–145)
Total Bilirubin: 0.6 mg/dL (ref 0.3–1.2)
Total Protein: 7.1 g/dL (ref 6.5–8.1)

## 2015-08-16 LAB — CBC WITH DIFFERENTIAL/PLATELET
BASOS ABS: 0 10*3/uL (ref 0.0–0.1)
BASOS PCT: 0 %
Eosinophils Absolute: 0.4 10*3/uL (ref 0.0–0.7)
Eosinophils Relative: 4 %
HEMATOCRIT: 43.5 % (ref 39.0–52.0)
HEMOGLOBIN: 14.3 g/dL (ref 13.0–17.0)
LYMPHS PCT: 33 %
Lymphs Abs: 3 10*3/uL (ref 0.7–4.0)
MCH: 26.7 pg (ref 26.0–34.0)
MCHC: 32.9 g/dL (ref 30.0–36.0)
MCV: 81.2 fL (ref 78.0–100.0)
Monocytes Absolute: 0.8 10*3/uL (ref 0.1–1.0)
Monocytes Relative: 9 %
NEUTROS ABS: 5.1 10*3/uL (ref 1.7–7.7)
NEUTROS PCT: 54 %
PLATELETS: 294 10*3/uL (ref 150–400)
RBC: 5.36 MIL/uL (ref 4.22–5.81)
RDW: 13.2 % (ref 11.5–15.5)
WBC: 9.3 10*3/uL (ref 4.0–10.5)

## 2015-08-16 LAB — PROTIME-INR
INR: 1.09 (ref 0.00–1.49)
PROTHROMBIN TIME: 14.3 s (ref 11.6–15.2)

## 2015-08-16 LAB — URINALYSIS, ROUTINE W REFLEX MICROSCOPIC
Bilirubin Urine: NEGATIVE
GLUCOSE, UA: NEGATIVE mg/dL
HGB URINE DIPSTICK: NEGATIVE
KETONES UR: NEGATIVE mg/dL
Leukocytes, UA: NEGATIVE
Nitrite: NEGATIVE
PH: 5.5 (ref 5.0–8.0)
PROTEIN: NEGATIVE mg/dL
Specific Gravity, Urine: 1.028 (ref 1.005–1.030)

## 2015-08-16 LAB — TYPE AND SCREEN
ABO/RH(D): O NEG
Antibody Screen: NEGATIVE

## 2015-08-16 LAB — SURGICAL PCR SCREEN
MRSA, PCR: NEGATIVE
Staphylococcus aureus: NEGATIVE

## 2015-08-16 LAB — APTT: APTT: 32 s (ref 24–37)

## 2015-08-16 LAB — ABO/RH: ABO/RH(D): O NEG

## 2015-08-16 NOTE — Pre-Procedure Instructions (Addendum)
Dwayne Delgado  08/16/2015      [id number] - [place], [place] [address] [place] [place] [address] Phone: [phone] Fax: [phone]    Your procedure is scheduled on May 1  Report to [facility] at 740 A.M.  Call this number if you have problems the morning of surgery:  [phone]   Remember:  Do not eat food or drink liquids after midnight.  Take these medicines the morning of surgery with A SIP OF WATER Tramadol (Ultram) if needed   Stop taking aspirin, BC's, Goody's, Herbal medications, Fish Oil, Ibuprofen, Advil, Motrin, Aleve   Do not wear jewelry, make-up or nail polish.  Do not wear lotions, powders, or perfumes.  You may wear deodorant.  Do not shave 48 hours prior to surgery.  Men may shave face and neck.  Do not bring valuables to the hospital.  [facility] is not responsible for any belongings or valuables.  Contacts, dentures or bridgework may not be worn into surgery.  Leave your suitcase in the car.  After surgery it may be brought to your room.  For patients admitted to the hospital, discharge time will be determined by your treatment team.  Patients discharged the day of surgery will not be allowed to drive home.    Special instructions:  [facility] - Preparing for Surgery  Before surgery, you can play an important role.  Because skin is not sterile, your skin needs to be as free of germs as possible.  You can reduce the number of germs on you skin by washing with CHG (chlorahexidine gluconate) soap before surgery.  CHG is an antiseptic cleaner which kills germs and bonds with the skin to continue killing germs even after washing.  Please DO NOT use if you have an allergy to CHG or antibacterial soaps.  If your skin becomes reddened/irritated stop using the CHG and inform your nurse when you arrive at Short Stay.  Do not shave (including legs and underarms) for at least 48 hours prior to the first  CHG shower.  You may shave your face.  Please follow these instructions carefully:   1.  Shower with CHG Soap the night before surgery and the                                morning of Surgery.  2.  If you choose to wash your hair, wash your hair first as usual with your       normal shampoo.  3.  After you shampoo, rinse your hair and body thoroughly to remove the                      Shampoo.  4.  Use CHG as you would any other liquid soap.  You can apply chg directly       to the skin and wash gently with scrungie or a clean washcloth.  5.  Apply the CHG Soap to your body ONLY FROM THE NECK DOWN.        Do not use on open wounds or open sores.  Avoid contact with your eyes,       ears, mouth and genitals (private parts).  Wash genitals (private parts)       with your normal soap.  6.  Wash thoroughly, paying special attention to the area where your surgery  will be performed.  7.  Thoroughly rinse your body with warm water from the neck down.  8.  DO NOT shower/wash with your normal soap after using and rinsing off       the CHG Soap.  9.  Pat yourself dry with a clean towel.            10.  Wear clean pajamas.            11.  Place clean sheets on your bed the night of your first shower and do not        sleep with pets.  Day of Surgery  Do not apply any lotions/deoderants the morning of surgery.  Please wear clean clothes to the hospital/surgery center.      Please read over the following fact sheets that you were given. Pain Booklet, Coughing and Deep Breathing, Blood Transfusion Information, MRSA Information and Surgical Site Infection Prevention

## 2015-08-16 NOTE — Progress Notes (Signed)
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 Denies ever having a card cath, stress test, or echo.

## 2015-08-17 LAB — URINE CULTURE: Culture: 8000 — AB

## 2015-08-26 MED ORDER — SODIUM CHLORIDE 0.9 % IV SOLN: INTRAVENOUS | Status: DC

## 2015-08-26 MED ORDER — SODIUM CHLORIDE 0.9 % IV SOLN
1000.0000 mg | INTRAVENOUS | Status: AC
  Administered 2015-08-27: 1000 mg via INTRAVENOUS
  Filled 2015-08-26: qty 10

## 2015-08-26 MED ORDER — CEFAZOLIN SODIUM-DEXTROSE 2-4 GM/100ML-% IV SOLN
2.0000 g | INTRAVENOUS | Status: AC
  Administered 2015-08-27: 2 g via INTRAVENOUS
  Filled 2015-08-26: qty 100

## 2015-08-26 MED ORDER — ACETAMINOPHEN 500 MG PO TABS
1000.0000 mg | ORAL_TABLET | Freq: Once | ORAL | Status: AC
  Administered 2015-08-27: 1000 mg via ORAL
  Filled 2015-08-26: qty 2

## 2015-08-27 ENCOUNTER — Inpatient Hospital Stay (HOSPITAL_COMMUNITY): Payer: No Typology Code available for payment source | Admitting: Certified Registered"

## 2015-08-27 ENCOUNTER — Encounter (HOSPITAL_COMMUNITY)
Admission: RE | Disposition: A | Discharge status: Home Health | Payer: Self-pay | Source: Ambulatory Visit | Attending: Orthopedic Surgery

## 2015-08-27 ENCOUNTER — Encounter (HOSPITAL_COMMUNITY): Payer: Self-pay | Admitting: *Deleted

## 2015-08-27 ENCOUNTER — Inpatient Hospital Stay (HOSPITAL_COMMUNITY)
Admission: RE | Admit: 2015-08-27 | Discharge: 2015-08-28 | DRG: 470 | Disposition: A | Discharge status: Home Health | Payer: No Typology Code available for payment source | Source: Ambulatory Visit | Attending: Orthopedic Surgery | Admitting: Orthopedic Surgery

## 2015-08-27 DIAGNOSIS — Z96659 Presence of unspecified artificial knee joint: Secondary | ICD-10-CM

## 2015-08-27 DIAGNOSIS — E669 Obesity, unspecified: Secondary | ICD-10-CM | POA: Diagnosis present

## 2015-08-27 DIAGNOSIS — M1712 Unilateral primary osteoarthritis, left knee: Principal | ICD-10-CM | POA: Diagnosis present

## 2015-08-27 DIAGNOSIS — Z6830 Body mass index (BMI) 30.0-30.9, adult: Secondary | ICD-10-CM | POA: Diagnosis not present

## 2015-08-27 DIAGNOSIS — D62 Acute posthemorrhagic anemia: Secondary | ICD-10-CM | POA: Diagnosis not present

## 2015-08-27 HISTORY — PX: TOTAL KNEE ARTHROPLASTY: SHX125

## 2015-08-27 LAB — CREATININE, SERUM
Creatinine, Ser: 0.95 mg/dL (ref 0.61–1.24)
GFR calc Af Amer: >60 mL/min (ref >60)

## 2015-08-27 LAB — CBC
HEMATOCRIT: 39.9 % (ref 39.0–52.0)
Hemoglobin: 13.1 g/dL (ref 13.0–17.0)
MCH: 26.8 pg (ref 26.0–34.0)
MCHC: 32.8 g/dL (ref 30.0–36.0)
MCV: 81.6 fL (ref 78.0–100.0)
Platelets: 198 10*3/uL (ref 150–400)
RBC: 4.89 MIL/uL (ref 4.22–5.81)
RDW: 13 % (ref 11.5–15.5)
WBC: 13.3 10*3/uL — ABNORMAL HIGH (ref 4.0–10.5)

## 2015-08-27 SURGERY — ARTHROPLASTY, KNEE, TOTAL
Anesthesia: Spinal | Laterality: Left

## 2015-08-27 MED ORDER — NEOSTIGMINE METHYLSULFATE 10 MG/10ML IV SOLN
INTRAVENOUS | Status: DC | PRN
  Administered 2015-08-27: 3 mg via INTRAVENOUS

## 2015-08-27 MED ORDER — MIDAZOLAM HCL 2 MG/2ML IJ SOLN
INTRAMUSCULAR | Status: AC
  Filled 2015-08-27: qty 2

## 2015-08-27 MED ORDER — TRANEXAMIC ACID 1000 MG/10ML IV SOLN
1000.0000 mg | Freq: Once | INTRAVENOUS | Status: AC
  Administered 2015-08-27: 1000 mg via INTRAVENOUS
  Filled 2015-08-27: qty 10

## 2015-08-27 MED ORDER — DIPHENHYDRAMINE HCL 12.5 MG/5ML PO ELIX: 12.5000 mg | ORAL_SOLUTION | ORAL | Status: DC | PRN

## 2015-08-27 MED ORDER — DEXAMETHASONE SODIUM PHOSPHATE 10 MG/ML IJ SOLN
INTRAMUSCULAR | Status: DC | PRN
  Administered 2015-08-27: 10 mg via INTRAVENOUS

## 2015-08-27 MED ORDER — ONDANSETRON HCL 4 MG PO TABS: 4.0000 mg | ORAL_TABLET | Freq: Four times a day (QID) | ORAL | Status: DC | PRN

## 2015-08-27 MED ORDER — METHOCARBAMOL 1000 MG/10ML IJ SOLN
500.0000 mg | Freq: Four times a day (QID) | INTRAVENOUS | Status: DC | PRN
  Administered 2015-08-27: 500 mg via INTRAVENOUS
  Filled 2015-08-27 (×2): qty 5

## 2015-08-27 MED ORDER — BUPIVACAINE-EPINEPHRINE (PF) 0.25% -1:200000 IJ SOLN
INTRAMUSCULAR | Status: AC
  Filled 2015-08-27: qty 30

## 2015-08-27 MED ORDER — GLYCOPYRROLATE 0.2 MG/ML IJ SOLN
INTRAMUSCULAR | Status: DC | PRN
  Administered 2015-08-27: 0.4 mg via INTRAVENOUS

## 2015-08-27 MED ORDER — LABETALOL HCL 5 MG/ML IV SOLN
INTRAVENOUS | Status: DC | PRN
  Administered 2015-08-27 (×3): 2.5 mg via INTRAVENOUS
  Administered 2015-08-27: 5 mg via INTRAVENOUS

## 2015-08-27 MED ORDER — MIDAZOLAM HCL 2 MG/2ML IJ SOLN
INTRAMUSCULAR | Status: DC | PRN
  Administered 2015-08-27 (×2): 2 mg via INTRAVENOUS

## 2015-08-27 MED ORDER — ENOXAPARIN SODIUM 30 MG/0.3ML ~~LOC~~ SOLN
30.0000 mg | Freq: Two times a day (BID) | SUBCUTANEOUS | Status: DC
  Administered 2015-08-28: 30 mg via SUBCUTANEOUS
  Filled 2015-08-27: qty 0.3

## 2015-08-27 MED ORDER — ONDANSETRON HCL 4 MG/2ML IJ SOLN: 4.0000 mg | Freq: Once | INTRAMUSCULAR | Status: DC | PRN

## 2015-08-27 MED ORDER — CELECOXIB 200 MG PO CAPS
200.0000 mg | ORAL_CAPSULE | Freq: Two times a day (BID) | ORAL | Status: DC
  Administered 2015-08-27 – 2015-08-28 (×3): 200 mg via ORAL
  Filled 2015-08-27 (×3): qty 1

## 2015-08-27 MED ORDER — ACETAMINOPHEN 325 MG PO TABS: 650.0000 mg | ORAL_TABLET | Freq: Four times a day (QID) | ORAL | Status: DC | PRN

## 2015-08-27 MED ORDER — BUPIVACAINE-EPINEPHRINE (PF) 0.25% -1:200000 IJ SOLN
INTRAMUSCULAR | Status: DC | PRN
  Administered 2015-08-27: 20 mL via PERINEURAL

## 2015-08-27 MED ORDER — ROCURONIUM BROMIDE 100 MG/10ML IV SOLN
INTRAVENOUS | Status: DC | PRN
  Administered 2015-08-27: 50 mg via INTRAVENOUS

## 2015-08-27 MED ORDER — BISACODYL 5 MG PO TBEC: 5.0000 mg | DELAYED_RELEASE_TABLET | Freq: Every day | ORAL | Status: DC | PRN

## 2015-08-27 MED ORDER — METHOCARBAMOL 500 MG PO TABS
500.0000 mg | ORAL_TABLET | Freq: Four times a day (QID) | ORAL | Status: DC | PRN
  Administered 2015-08-27 – 2015-08-28 (×3): 500 mg via ORAL
  Filled 2015-08-27 (×4): qty 1

## 2015-08-27 MED ORDER — CEFAZOLIN SODIUM 1-5 GM-% IV SOLN
1.0000 g | Freq: Four times a day (QID) | INTRAVENOUS | Status: AC
  Administered 2015-08-27 (×2): 1 g via INTRAVENOUS
  Filled 2015-08-27 (×2): qty 50

## 2015-08-27 MED ORDER — OXYCODONE HCL 5 MG PO TABS
5.0000 mg | ORAL_TABLET | ORAL | Status: DC | PRN
  Administered 2015-08-27 – 2015-08-28 (×5): 10 mg via ORAL
  Filled 2015-08-27 (×4): qty 2

## 2015-08-27 MED ORDER — MENTHOL 3 MG MT LOZG: 1.0000 | LOZENGE | OROMUCOSAL | Status: DC | PRN

## 2015-08-27 MED ORDER — METOCLOPRAMIDE HCL 5 MG/ML IJ SOLN: 5.0000 mg | Freq: Three times a day (TID) | INTRAMUSCULAR | Status: DC | PRN

## 2015-08-27 MED ORDER — LACTATED RINGERS IV SOLN
INTRAVENOUS | Status: DC
  Administered 2015-08-27 (×4): via INTRAVENOUS

## 2015-08-27 MED ORDER — MEPERIDINE HCL 25 MG/ML IJ SOLN: 6.2500 mg | INTRAMUSCULAR | Status: DC | PRN

## 2015-08-27 MED ORDER — FENTANYL CITRATE (PF) 250 MCG/5ML IJ SOLN
INTRAMUSCULAR | Status: AC
  Filled 2015-08-27: qty 5

## 2015-08-27 MED ORDER — PHENOL 1.4 % MT LIQD: 1.0000 | OROMUCOSAL | Status: DC | PRN

## 2015-08-27 MED ORDER — LABETALOL HCL 5 MG/ML IV SOLN
INTRAVENOUS | Status: AC
  Filled 2015-08-27: qty 4

## 2015-08-27 MED ORDER — LEFLUNOMIDE 20 MG PO TABS
20.0000 mg | ORAL_TABLET | Freq: Every day | ORAL | Status: DC
  Administered 2015-08-27 – 2015-08-28 (×2): 20 mg via ORAL
  Filled 2015-08-27 (×2): qty 1

## 2015-08-27 MED ORDER — PHENYLEPHRINE HCL 10 MG/ML IJ SOLN
INTRAMUSCULAR | Status: DC | PRN
  Administered 2015-08-27 (×3): 80 ug via INTRAVENOUS
  Administered 2015-08-27: 40 ug via INTRAVENOUS
  Administered 2015-08-27 (×2): 80 ug via INTRAVENOUS

## 2015-08-27 MED ORDER — BUPIVACAINE-EPINEPHRINE (PF) 0.5% -1:200000 IJ SOLN
INTRAMUSCULAR | Status: DC | PRN
  Administered 2015-08-27: 30 mL via PERINEURAL

## 2015-08-27 MED ORDER — OXYCODONE HCL 5 MG PO TABS
ORAL_TABLET | ORAL | Status: AC
  Filled 2015-08-27: qty 2

## 2015-08-27 MED ORDER — DOCUSATE SODIUM 100 MG PO CAPS
100.0000 mg | ORAL_CAPSULE | Freq: Two times a day (BID) | ORAL | Status: DC
  Administered 2015-08-27 – 2015-08-28 (×2): 100 mg via ORAL
  Filled 2015-08-27 (×2): qty 1

## 2015-08-27 MED ORDER — ACETAMINOPHEN 650 MG RE SUPP: 650.0000 mg | Freq: Four times a day (QID) | RECTAL | Status: DC | PRN

## 2015-08-27 MED ORDER — EPHEDRINE SULFATE 50 MG/ML IJ SOLN
INTRAMUSCULAR | Status: DC | PRN
  Administered 2015-08-27: 5 mg via INTRAVENOUS

## 2015-08-27 MED ORDER — CHLORHEXIDINE GLUCONATE 4 % EX LIQD: 60.0000 mL | Freq: Once | CUTANEOUS | Status: DC

## 2015-08-27 MED ORDER — HYDROMORPHONE HCL 1 MG/ML IJ SOLN
INTRAMUSCULAR | Status: AC
  Filled 2015-08-27: qty 1

## 2015-08-27 MED ORDER — PROPOFOL 1000 MG/100ML IV EMUL
INTRAVENOUS | Status: AC
  Filled 2015-08-27: qty 100

## 2015-08-27 MED ORDER — SODIUM CHLORIDE 0.9 % IR SOLN
Status: DC | PRN
  Administered 2015-08-27: 1000 mL

## 2015-08-27 MED ORDER — TERBINAFINE HCL 250 MG PO TABS
250.0000 mg | ORAL_TABLET | Freq: Every day | ORAL | Status: DC
  Administered 2015-08-27: 250 mg via ORAL
  Filled 2015-08-27 (×2): qty 1

## 2015-08-27 MED ORDER — FLEET ENEMA 7-19 GM/118ML RE ENEM: 1.0000 | ENEMA | Freq: Once | RECTAL | Status: DC | PRN

## 2015-08-27 MED ORDER — BUPIVACAINE LIPOSOME 1.3 % IJ SUSP
INTRAMUSCULAR | Status: DC | PRN
  Administered 2015-08-27: 20 mL

## 2015-08-27 MED ORDER — HYDROMORPHONE HCL 1 MG/ML IJ SOLN
0.2500 mg | INTRAMUSCULAR | Status: DC | PRN
  Administered 2015-08-27 (×4): 0.5 mg via INTRAVENOUS

## 2015-08-27 MED ORDER — HYDROMORPHONE HCL 1 MG/ML IJ SOLN
0.2500 mg | INTRAMUSCULAR | Status: DC | PRN
  Administered 2015-08-27: 0.5 mg via INTRAVENOUS

## 2015-08-27 MED ORDER — OXYCODONE HCL ER 10 MG PO T12A
10.0000 mg | EXTENDED_RELEASE_TABLET | Freq: Two times a day (BID) | ORAL | Status: DC
  Administered 2015-08-27 – 2015-08-28 (×2): 10 mg via ORAL
  Filled 2015-08-27 (×3): qty 1

## 2015-08-27 MED ORDER — HYDROMORPHONE HCL 1 MG/ML IJ SOLN
1.0000 mg | INTRAMUSCULAR | Status: DC | PRN
  Administered 2015-08-28: 1 mg via INTRAVENOUS
  Filled 2015-08-27: qty 1

## 2015-08-27 MED ORDER — ZOLPIDEM TARTRATE 5 MG PO TABS: 5.0000 mg | ORAL_TABLET | Freq: Every evening | ORAL | Status: DC | PRN

## 2015-08-27 MED ORDER — METOCLOPRAMIDE HCL 5 MG PO TABS: 5.0000 mg | ORAL_TABLET | Freq: Three times a day (TID) | ORAL | Status: DC | PRN

## 2015-08-27 MED ORDER — ONDANSETRON HCL 4 MG/2ML IJ SOLN: 4.0000 mg | Freq: Four times a day (QID) | INTRAMUSCULAR | Status: DC | PRN

## 2015-08-27 MED ORDER — PROPOFOL 10 MG/ML IV BOLUS
INTRAVENOUS | Status: DC | PRN
  Administered 2015-08-27: 60 mg via INTRAVENOUS
  Administered 2015-08-27: 180 mg via INTRAVENOUS
  Administered 2015-08-27: 40 mg via INTRAVENOUS
  Administered 2015-08-27: 60 mg via INTRAVENOUS
  Administered 2015-08-27: 40 mg via INTRAVENOUS
  Administered 2015-08-27: 20 mg via INTRAVENOUS

## 2015-08-27 MED ORDER — PROPOFOL 10 MG/ML IV BOLUS
INTRAVENOUS | Status: AC
  Filled 2015-08-27: qty 20

## 2015-08-27 MED ORDER — SODIUM CHLORIDE 0.9 % IJ SOLN
INTRAMUSCULAR | Status: DC | PRN
  Administered 2015-08-27 (×2): 10 mL

## 2015-08-27 MED ORDER — SENNOSIDES-DOCUSATE SODIUM 8.6-50 MG PO TABS: 1.0000 | ORAL_TABLET | Freq: Every evening | ORAL | Status: DC | PRN

## 2015-08-27 MED ORDER — BUPIVACAINE LIPOSOME 1.3 % IJ SUSP
20.0000 mL | INTRAMUSCULAR | Status: DC
  Filled 2015-08-27: qty 20

## 2015-08-27 MED ORDER — FENTANYL CITRATE (PF) 250 MCG/5ML IJ SOLN
INTRAMUSCULAR | Status: DC | PRN
Start: 2015-08-27 — End: 2015-08-27
  Administered 2015-08-27: 100 ug via INTRAVENOUS
  Administered 2015-08-27 (×2): 50 ug via INTRAVENOUS
  Administered 2015-08-27: 100 ug via INTRAVENOUS
  Administered 2015-08-27 (×3): 50 ug via INTRAVENOUS

## 2015-08-27 MED ORDER — SODIUM CHLORIDE 0.9 % IV SOLN: INTRAVENOUS | Status: DC

## 2015-08-27 MED ORDER — 0.9 % SODIUM CHLORIDE (POUR BTL) OPTIME
TOPICAL | Status: DC | PRN
  Administered 2015-08-27: 1000 mL

## 2015-08-27 MED ORDER — ALUM & MAG HYDROXIDE-SIMETH 200-200-20 MG/5ML PO SUSP: 30.0000 mL | ORAL | Status: DC | PRN

## 2015-08-27 SURGICAL SUPPLY — 61 items
ATTUNE SOLO TIB PRP FB RPSZ6-8 (KITS) ×6 IMPLANT
BANDAGE ESMARK 6X9 LF (GAUZE/BANDAGES/DRESSINGS) ×1 IMPLANT
BLADE SAGITTAL 13X1.27X60 (BLADE) ×2 IMPLANT
BLADE SAGITTAL 13X1.27X60MM (BLADE) ×1
BLADE SAW SGTL 83.5X18.5 (BLADE) ×3 IMPLANT
BLADE SURG 10 STRL SS (BLADE) ×3 IMPLANT
BNDG ESMARK 6X9 LF (GAUZE/BANDAGES/DRESSINGS) ×3
BOWL SMART MIX CTS (DISPOSABLE) ×3 IMPLANT
CAPT JOINT BLOCK ×6 IMPLANT
CAPT KNEE TOTAL 3 ATTUNE ×3 IMPLANT
CEMENT BONE SIMPLEX SPEEDSET (Cement) ×6 IMPLANT
COVER SURGICAL LIGHT HANDLE (MISCELLANEOUS) ×3 IMPLANT
CUFF TOURNIQUET SINGLE 34IN LL (TOURNIQUET CUFF) ×3 IMPLANT
DRAPE EXTREMITY T 121X128X90 (DRAPE) ×3 IMPLANT
DRAPE INCISE IOBAN 66X45 STRL (DRAPES) ×6 IMPLANT
DRAPE PROXIMA HALF (DRAPES) IMPLANT
DRAPE U-SHAPE 47X51 STRL (DRAPES) ×3 IMPLANT
DRSG ADAPTIC 3X8 NADH LF (GAUZE/BANDAGES/DRESSINGS) ×3 IMPLANT
DRSG PAD ABDOMINAL 8X10 ST (GAUZE/BANDAGES/DRESSINGS) ×3 IMPLANT
DURAPREP 26ML APPLICATOR (WOUND CARE) ×6 IMPLANT
ELECT REM PT RETURN 9FT ADLT (ELECTROSURGICAL) ×3
ELECTRODE REM PT RTRN 9FT ADLT (ELECTROSURGICAL) ×1 IMPLANT
GAUZE SPONGE 4X4 12PLY STRL (GAUZE/BANDAGES/DRESSINGS) ×3 IMPLANT
GLOVE BIOGEL M 7.0 STRL (GLOVE) IMPLANT
GLOVE BIOGEL PI IND STRL 7.5 (GLOVE) IMPLANT
GLOVE BIOGEL PI IND STRL 8.5 (GLOVE) ×5 IMPLANT
GLOVE BIOGEL PI INDICATOR 7.5 (GLOVE)
GLOVE BIOGEL PI INDICATOR 8.5 (GLOVE) ×10
GLOVE SURG ORTHO 8.0 STRL STRW (GLOVE) ×18 IMPLANT
GOWN STRL REUS W/ TWL LRG LVL3 (GOWN DISPOSABLE) ×1 IMPLANT
GOWN STRL REUS W/ TWL XL LVL3 (GOWN DISPOSABLE) ×2 IMPLANT
GOWN STRL REUS W/TWL 2XL LVL3 (GOWN DISPOSABLE) ×3 IMPLANT
GOWN STRL REUS W/TWL LRG LVL3 (GOWN DISPOSABLE) ×2
GOWN STRL REUS W/TWL XL LVL3 (GOWN DISPOSABLE) ×4
HANDPIECE INTERPULSE COAX TIP (DISPOSABLE) ×2
HOOD PEEL AWAY FACE SHEILD DIS (HOOD) ×12 IMPLANT
KIT BASIN OR (CUSTOM PROCEDURE TRAY) ×3 IMPLANT
KIT ROOM TURNOVER OR (KITS) ×3 IMPLANT
MANIFOLD NEPTUNE II (INSTRUMENTS) ×3 IMPLANT
NEEDLE 22X1 1/2 (OR ONLY) (NEEDLE) ×6 IMPLANT
NS IRRIG 1000ML POUR BTL (IV SOLUTION) ×3 IMPLANT
PACK TOTAL JOINT (CUSTOM PROCEDURE TRAY) ×3 IMPLANT
PACK UNIVERSAL I (CUSTOM PROCEDURE TRAY) ×3 IMPLANT
PAD ARMBOARD 7.5X6 YLW CONV (MISCELLANEOUS) ×6 IMPLANT
PADDING CAST COTTON 6X4 STRL (CAST SUPPLIES) ×3 IMPLANT
SET HNDPC FAN SPRY TIP SCT (DISPOSABLE) ×1 IMPLANT
SPONGE GAUZE 4X4 12PLY STER LF (GAUZE/BANDAGES/DRESSINGS) ×3 IMPLANT
STAPLER VISISTAT 35W (STAPLE) ×3 IMPLANT
SUCTION FRAZIER HANDLE 10FR (MISCELLANEOUS)
SUCTION TUBE FRAZIER 10FR DISP (MISCELLANEOUS) IMPLANT
SUT BONE WAX W31G (SUTURE) ×3 IMPLANT
SUT VIC AB 0 CTB1 27 (SUTURE) ×6 IMPLANT
SUT VIC AB 1 CT1 27 (SUTURE) ×4
SUT VIC AB 1 CT1 27XBRD ANBCTR (SUTURE) ×2 IMPLANT
SUT VIC AB 2-0 CT1 27 (SUTURE) ×4
SUT VIC AB 2-0 CT1 TAPERPNT 27 (SUTURE) ×2 IMPLANT
SYR 20CC LL (SYRINGE) ×6 IMPLANT
TOWEL OR 17X24 6PK STRL BLUE (TOWEL DISPOSABLE) ×3 IMPLANT
TOWEL OR 17X26 10 PK STRL BLUE (TOWEL DISPOSABLE) ×3 IMPLANT
WATER STERILE IRR 1000ML POUR (IV SOLUTION) IMPLANT
attune femoral preip and trials posterior stabilze ×3 IMPLANT

## 2015-08-27 NOTE — Anesthesia Procedure Notes (Addendum)
Anesthesia Regional Block:  Adductor canal block  Pre-Anesthetic Checklist: ,, timeout performed, Correct Patient, Correct Site, Correct Laterality, Correct Procedure, Correct Position, site marked, Risks and benefits discussed,  Surgical consent,  Pre-op evaluation,  At surgeon's request and post-op pain management  Laterality: Left  Prep: chloraprep       Needles:  Injection technique: Single-shot  Needle Type: Echogenic Stimulator Needle     Needle Length: 9cm 9 cm Needle Gauge: 21 and 21 G    Additional Needles:  Procedures: ultrasound guided (picture in chart) Adductor canal block Narrative:  Start time: 08/27/2015 12:35 PM End time: 08/27/2015 12:45 PM Injection made incrementally with aspirations every 5 mL.  Performed by: Personally  Anesthesiologist: Lillia Abed  Additional Notes: Monitors applied. Patient sedated. Sterile prep and drape,hand hygiene and sterile gloves were used. Relevant anatomy identified.Needle position confirmed.Local anesthetic injected incrementally after negative aspiration. Local anesthetic spread visualized around nerve(s). Vascular puncture avoided. No complications. Image printed for medical record.The patient tolerated the procedure well.    Lillia Abed MD   Procedure Name: Intubation Date/Time: 08/27/2015 10:39 AM Performed by: Adalberto Ill Pre-anesthesia Checklist: Emergency Drugs available, Patient identified, Suction available, Patient being monitored and Timeout performed Patient Re-evaluated:Patient Re-evaluated prior to inductionOxygen Delivery Method: Circle system utilized Preoxygenation: Pre-oxygenation with 100% oxygen Intubation Type: IV induction Ventilation: Mask ventilation without difficulty Laryngoscope Size: Glidescope, 4 and Mac Grade View: Grade II Tube type: Oral Tube size: 7.5 mm Number of attempts: 3 Placement Confirmation: ETT inserted through vocal cords under direct vision,  positive ETCO2 and breath sounds  checked- equal and bilateral Secured at: 22 (note that front tooth lower came out easily just prior to initial placement of oral airway ) cm Tube secured with: Tape Dental Injury: Dental damage  Difficulty Due To: Difficult Airway- due to anterior larynx, Difficult Airway- due to limited oral opening and Difficult Airway- due to dentition Future Recommendations: Recommend- induction with short-acting agent, and alternative techniques readily available

## 2015-08-27 NOTE — Anesthesia Preprocedure Evaluation (Signed)
Anesthesia Evaluation  Patient identified by MRN, date of birth, ID band Patient awake    Reviewed: Allergy & Precautions, NPO status , Patient's Chart, lab work & pertinent test results  Airway Mallampati: I  TM Distance: >3 FB Neck ROM: Full    Dental   Pulmonary    Pulmonary exam normal       Cardiovascular Normal cardiovascular exam    Neuro/Psych    GI/Hepatic   Endo/Other    Renal/GU      Musculoskeletal   Abdominal   Peds  Hematology   Anesthesia Other Findings   Reproductive/Obstetrics                             Anesthesia Physical Anesthesia Plan  ASA: II  Anesthesia Plan: Spinal   Post-op Pain Management:    Induction: Intravenous  Airway Management Planned: Natural Airway  Additional Equipment:   Intra-op Plan:   Post-operative Plan:   Informed Consent: I have reviewed the patients History and Physical, chart, labs and discussed the procedure including the risks, benefits and alternatives for the proposed anesthesia with the patient or authorized representative who has indicated his/her understanding and acceptance.     Plan Discussed with: CRNA and Surgeon  Anesthesia Plan Comments:         Anesthesia Quick Evaluation  

## 2015-08-27 NOTE — Transfer of Care (Signed)
Immediate Anesthesia Transfer of Care Note  Patient: Dwayne Delgado  Procedure(s) Performed: Procedure(s): TOTAL KNEE ARTHROPLASTY (Left)  Patient Location: PACU  Anesthesia Type:General  Level of Consciousness: awake, alert , oriented and patient cooperative  Airway & Oxygen Therapy: Patient Spontanous Breathing and Patient connected to face mask oxygen  Post-op Assessment: Report given to RN and Post -op Vital signs reviewed and stable  Post vital signs: Reviewed and stable  Last Vitals:  Filed Vitals:   08/27/15 0803 08/27/15 1300  BP: 137/101   Pulse: 74 76  Temp: 36.5 C 36.2 C  Resp: 20     Last Pain: There were no vitals filed for this visit.       Complications: No apparent anesthesia complications

## 2015-08-27 NOTE — Anesthesia Postprocedure Evaluation (Signed)
Anesthesia Post Note  Patient: Dwayne Delgado  Procedure(s) Performed: Procedure(s) (LRB): TOTAL KNEE ARTHROPLASTY (Left)  Patient location during evaluation: PACU Anesthesia Type: General Level of consciousness: awake and alert Pain management: pain level controlled Vital Signs Assessment: post-procedure vital signs reviewed and stable Respiratory status: spontaneous breathing, nonlabored ventilation, respiratory function stable and patient connected to nasal cannula oxygen Cardiovascular status: blood pressure returned to baseline and stable Postop Assessment: no signs of nausea or vomiting Anesthetic complications: no Comments: Unable to perform spinal, converted to general anesthetic. Adductor canal block performed before wake up to help with post op pain.  One incisor removed from mouth due to worry of aspiration. Discussed with patient and wife.    Last Vitals:  Filed Vitals:   08/27/15 1415 08/27/15 1500  BP: 128/87 126/81  Pulse: 81 95  Temp:  37.2 C  Resp: 13 18    Last Pain:  Filed Vitals:   08/27/15 1501  PainSc: 6                  Teancum Brule DAVID

## 2015-08-27 NOTE — Progress Notes (Signed)
Orthopedic Tech Progress Note Patient Details:  Dwayne Delgado 07-Dec-1965 [id number]  CPM Left Knee CPM Left Knee: On Left Knee Flexion (Degrees): 90 Left Knee Extension (Degrees): 0 Additional Comments: Trapeze bar and foot roll   Dwayne Delgado 08/27/2015, 1:55 PM

## 2015-08-27 NOTE — H&P (Signed)
Dwayne Delgado MRN:  [id number] DOB/SEX:  04/01/1966/male  CHIEF COMPLAINT:  Painful left Knee  HISTORY: Patient is a 50 y.o. male presented with a history of pain in the left knee. Onset of symptoms was gradual starting a few years ago with gradually worsening course since that time. Patient has been treated conservatively with over-the-counter NSAIDs and activity modification. Patient currently rates pain in the knee at 10 out of 10 with activity. There is pain at night.  PAST MEDICAL HISTORY: Patient Active Problem List   Diagnosis Date Noted  . Nocturia 06/13/2014  . Healthcare maintenance 05/27/2011  . Obesity, Class I, BMI 30-34.9 04/09/2010   Past Medical History  Diagnosis Date  . Obesity   . Degenerative arthritis of knee, bilateral   . RLL pneumonia 06/13/2014   Past Surgical History  Procedure Laterality Date  . Knee arthroscopy Bilateral 1993, 01/2014  . Wisdom tooth extraction       MEDICATIONS:   No prescriptions prior to admission    ALLERGIES:  No Known Allergies  REVIEW OF SYSTEMS:  A comprehensive review of systems was negative except for: Musculoskeletal: positive for arthralgias and stiff joints   FAMILY HISTORY:   Family History  Problem Relation Age of Onset  . Coronary artery disease Father 52    MI  . Cancer Neg Hx   . Stroke Neg Hx   . Diabetes Neg Hx   . Hypertension Neg Hx     SOCIAL HISTORY:   Social History  Substance Use Topics  . Smoking status: Never Smoker   . Smokeless tobacco: Never Used  . Alcohol Use: 0.0 oz/week    0 Standard drinks or equivalent per week     Comment: Very rare     EXAMINATION:  Vital signs in last 24 hours:    There were no vitals taken for this visit.  General Appearance:    Alert, cooperative, no distress, appears stated age  Head:    Normocephalic, without obvious abnormality, atraumatic  Eyes:    PERRL, conjunctiva/corneas clear, EOM's intact, fundi    benign, both eyes       Ears:    Normal  TM's and external ear canals, both ears  Nose:   Nares normal, septum midline, mucosa normal, no drainage    or sinus tenderness  Throat:   Lips, mucosa, and tongue normal; teeth and gums normal  Neck:   Supple, symmetrical, trachea midline, no adenopathy;       thyroid:  No enlargement/tenderness/nodules; no carotid   bruit or JVD  Back:     Symmetric, no curvature, ROM normal, no CVA tenderness  Lungs:     Clear to auscultation bilaterally, respirations unlabored  Chest wall:    No tenderness or deformity  Heart:    Regular rate and rhythm, S1 and S2 normal, no murmur, rub   or gallop  Abdomen:     Soft, non-tender, bowel sounds active all four quadrants,    no masses, no organomegaly  Genitalia:    Normal male without lesion, discharge or tenderness  Rectal:    Normal tone, normal prostate, no masses or tenderness;   guaiac negative stool  Extremities:   Extremities normal, atraumatic, no cyanosis or edema  Pulses:   2+ and symmetric all extremities  Skin:   Skin color, texture, turgor normal, no rashes or lesions  Lymph nodes:   Cervical, supraclavicular, and axillary nodes normal  Neurologic:   CNII-XII intact. Normal strength, sensation and reflexes  throughout    Musculoskeletal:  ROM 0-120, Ligaments intact,  Imaging Review Plain radiographs demonstrate severe degenerative joint disease of the left knee. The overall alignment is neutral. The bone quality appears to be excellent for age and reported activity level.  Assessment/Plan: Primary osteoarthritis, left knee   The patient history, physical examination and imaging studies are consistent with advanced degenerative joint disease of the left knee. The patient has failed conservative treatment.  The clearance notes were reviewed.  After discussion with the patient it was felt that Total Knee Replacement was indicated. The procedure,  risks, and benefits of total knee arthroplasty were presented and reviewed. The risks  including but not limited to aseptic loosening, infection, blood clots, vascular injury, stiffness, patella tracking problems complications among others were discussed. The patient acknowledged the explanation, agreed to proceed with the plan.  Donia Ast 08/27/2015, 6:38 AM

## 2015-08-27 NOTE — Evaluation (Signed)
Physical Therapy Evaluation Patient Details Name: Dwayne Delgado MRN: [id number] DOB: January 23, 1966 Today's Date: 08/27/2015   History of Present Illness  Pt is a 50 y.o. male now s/p Lt TKA. PMH: knee arthroscopy, obesity  Clinical Impression  Pt is s/p TKA resulting in the deficits listed below (see PT Problem List).  Pt will benefit from skilled PT to increase their independence and safety with mobility to allow discharge to home with family and friends to assist. Pt mobilizing well for initial PT session, ambulating 15 feet with rw. Will progress as tolerated.       Follow Up Recommendations Home health PT;Supervision - Intermittent    Equipment Recommendations  None recommended by PT (pt reports having rw at home)    Recommendations for Other Services       Precautions / Restrictions Precautions Precautions: Knee;Fall Precaution Booklet Issued: Yes (comment) Precaution Comments: HEP provided, reviewed knee extension precautions Restrictions Weight Bearing Restrictions: Yes LLE Weight Bearing: Weight bearing as tolerated      Mobility  Bed Mobility Overal bed mobility: Needs Assistance Bed Mobility: Supine to Sit     Supine to sit: Supervision;HOB elevated     General bed mobility comments: using rail to assist  Transfers Overall transfer level: Needs assistance Equipment used: Rolling walker (2 wheeled) Transfers: Sit to/from Stand Sit to Stand: Min guard         General transfer comment: steady with initial stand  Ambulation/Gait Ambulation/Gait assistance: Min guard Ambulation Distance (Feet): 15 Feet Assistive device: Rolling walker (2 wheeled) Gait Pattern/deviations: Step-to pattern;Decreased weight shift to left;Decreased stance time - left Gait velocity: decreased   General Gait Details: steady pattern when using rw.   Stairs            Wheelchair Mobility    Modified Rankin (Stroke Patients Only)       Balance Overall balance  assessment: Needs assistance Sitting-balance support: No upper extremity supported Sitting balance-Leahy Scale: Good     Standing balance support: Bilateral upper extremity supported Standing balance-Leahy Scale: Poor Standing balance comment: using rw                             Pertinent Vitals/Pain Pain Assessment: 0-10 Pain Score: 7  Pain Location: Lt knee Pain Descriptors / Indicators: Aching Pain Intervention(s): Limited activity within patient's tolerance;Monitored during session;Ice applied    Home Living Family/patient expects to be discharged to:: Private residence Living Arrangements: Spouse/significant other Available Help at Discharge: Family;Available PRN/intermittently Type of Home: House Home Access: Stairs to enter Entrance Stairs-Rails: None Entrance Stairs-Number of Steps: 1 Home Layout: Two level;Able to live on main level with bedroom/bathroom;Other (Comment) (bathroom on main level, will sleep on couch to start. ) Home Equipment: Walker - 2 wheels      Prior Function Level of Independence: Independent               Hand Dominance        Extremity/Trunk Assessment   Upper Extremity Assessment: Overall WFL for tasks assessed           Lower Extremity Assessment: LLE deficits/detail   LLE Deficits / Details: able to perform SLR with min assist     Communication   Communication: No difficulties  Cognition Arousal/Alertness: Awake/alert Behavior During Therapy: WFL for tasks assessed/performed Overall Cognitive Status: Within Functional Limits for tasks assessed  General Comments      Exercises        Assessment/Plan    PT Assessment Patient needs continued PT services  PT Diagnosis Difficulty walking   PT Problem List Decreased strength;Decreased range of motion;Decreased activity tolerance;Decreased balance;Decreased mobility  PT Treatment Interventions     PT Goals (Current goals  can be found in the Care Plan section) Acute Rehab PT Goals Patient Stated Goal: be able to play with kids PT Goal Formulation: With patient Time For Goal Achievement: 09/10/15 Potential to Achieve Goals: Good    Frequency 7X/week   Barriers to discharge        Co-evaluation               End of Session Equipment Utilized During Treatment: Gait belt Activity Tolerance: Patient tolerated treatment well Patient left: in chair;with call bell/phone within reach;with family/visitor present;Other (comment) (in bone foam) Nurse Communication: Mobility status;Weight bearing status         Time: [id number] PT Time Calculation (min) (ACUTE ONLY): 43 min   Charges:   PT Evaluation $PT Eval Moderate Complexity: 1 Procedure PT Treatments $Gait Training: 8-22 mins $Therapeutic Activity: 8-22 mins   PT G Codes:       Cassell Clement, PT, CSCS Pager [phone] Office [phone]  08/27/2015, 4:17 PM

## 2015-08-28 LAB — BASIC METABOLIC PANEL
ANION GAP: 8 (ref 5–15)
BUN: 13 mg/dL (ref 6–20)
CHLORIDE: 107 mmol/L (ref 101–111)
CO2: 23 mmol/L (ref 22–32)
Calcium: 8.8 mg/dL — ABNORMAL LOW (ref 8.9–10.3)
Creatinine, Ser: 0.82 mg/dL (ref 0.61–1.24)
GFR calc Af Amer: >60 mL/min (ref >60)
GFR calc non Af Amer: >60 mL/min (ref >60)
Glucose, Bld: 153 mg/dL — ABNORMAL HIGH (ref 65–99)
POTASSIUM: 4.2 mmol/L (ref 3.5–5.1)
SODIUM: 138 mmol/L (ref 135–145)

## 2015-08-28 LAB — CBC
HCT: 37 % — ABNORMAL LOW (ref 39.0–52.0)
HEMOGLOBIN: 12.1 g/dL — AB (ref 13.0–17.0)
MCH: 26.2 pg (ref 26.0–34.0)
MCHC: 32.7 g/dL (ref 30.0–36.0)
MCV: 80.1 fL (ref 78.0–100.0)
Platelets: 275 10*3/uL (ref 150–400)
RBC: 4.62 MIL/uL (ref 4.22–5.81)
RDW: 12.9 % (ref 11.5–15.5)
WBC: 12.1 10*3/uL — AB (ref 4.0–10.5)

## 2015-08-28 MED ORDER — OXYCODONE HCL 5 MG PO TABS: 5.0000 mg | ORAL_TABLET | ORAL | Status: DC | PRN

## 2015-08-28 MED ORDER — METHOCARBAMOL 500 MG PO TABS: 500.0000 mg | ORAL_TABLET | Freq: Four times a day (QID) | ORAL | Status: DC | PRN

## 2015-08-28 MED ORDER — ONDANSETRON HCL 4 MG PO TABS: 4.0000 mg | ORAL_TABLET | Freq: Four times a day (QID) | ORAL | Status: DC | PRN

## 2015-08-28 MED ORDER — ENOXAPARIN SODIUM 40 MG/0.4ML ~~LOC~~ SOLN: 40.0000 mg | SUBCUTANEOUS | Status: DC

## 2015-08-28 MED ORDER — OXYCODONE HCL ER 10 MG PO T12A: 10.0000 mg | EXTENDED_RELEASE_TABLET | Freq: Two times a day (BID) | ORAL | Status: DC

## 2015-08-28 NOTE — Evaluation (Signed)
Occupational Therapy Evaluation and Discharge Patient Details Name: Dwayne Delgado MRN: [id number] DOB: 02-25-1966 Today's Date: 08/28/2015    History of Present Illness Pt is a 50 y.o. male now s/p Lt TKA. PMH: knee arthroscopy, obesity   Clinical Impression   This 50 yo male admitted and underwent above presents to acute OT with all education completed we will D/C from acute OT.    Follow Up Recommendations  No OT follow up    Equipment Recommendations  None recommended by OT       Precautions / Restrictions Precautions Precautions: Knee;Fall Restrictions Weight Bearing Restrictions: No LLE Weight Bearing: Weight bearing as tolerated      Mobility Bed Mobility     General bed mobility comments: up in recliner upon arrval  Transfers Overall transfer level: Needs assistance Equipment used: Rolling walker (2 wheeled);None Transfers: Sit to/from Stand Sit to Stand: Modified independent (Device/Increase time)         General transfer comment: Walked without AD 30 feet         ADL Overall ADL's : Modified independent                                        educated pt on most efficient sequencing of getting dressed               Pertinent Vitals/Pain Pain Assessment: 0-10 Pain Score: 1  Pain Location: left knee Pain Descriptors / Indicators: Aching Pain Intervention(s): Monitored during session;Repositioned     Hand Dominance Right   Extremity/Trunk Assessment Upper Extremity Assessment Upper Extremity Assessment: Overall WFL for tasks assessed           Communication Communication Communication: No difficulties   Cognition Arousal/Alertness: Awake/alert Behavior During Therapy: WFL for tasks assessed/performed Overall Cognitive Status: Within Functional Limits for tasks assessed                                Home Living Family/patient expects to be discharged to:: Private residence Living Arrangements:  Spouse/significant other;Non-relatives/Friends Available Help at Discharge: Family;Available PRN/intermittently Type of Home: House Home Access: Stairs to enter [employer] of Steps: 1 Entrance Stairs-Rails: None Home Layout: Two level;Able to live on main level with bedroom/bathroom;Other (Comment) (1/2 bath on main level, will stay on couch for a few days) Alternate Level Stairs-Number of Steps: flight   Bathroom Shower/Tub: Walk-in shower;Door   [employer] Toilet: Standard     Home Equipment: Environmental consultant - 2 wheels;Shower seat;Bedside commode          Prior Functioning/Environment Level of Independence: Independent        Comments: Served in [employer] for 10 years    OT Diagnosis: Generalized weakness;Acute pain         OT Goals(Current goals can be found in the care plan section) Acute Rehab OT Goals Patient Stated Goal: to home today  OT Frequency:                End of Session Equipment Utilized During Treatment: Gait belt CPM Left Knee CPM Left Knee: Off Additional Comments:  (bone foam (zero degree knee) in place )  Activity Tolerance: Patient tolerated treatment well Patient left: in chair;with call bell/phone within reach   Time: [id number] OT Time Calculation (min): 12 min Charges:  OT General Charges $OT Visit: 1 Procedure OT Evaluation $OT  Eval Low Complexity: 1 Procedure  Almon Register [id number] 08/28/2015, 11:25 AM

## 2015-08-28 NOTE — Discharge Summary (Signed)
SPORTS MEDICINE & JOINT REPLACEMENT   Lara Mulch, MD   Carlynn Spry, PA-C Carlyon Shadow, PA-C [place], [place], [place]  [id number]                             [phone]  PATIENT ID: Dwayne Delgado        MRN:  [id number]          DOB/AGE: 48/25/1967 / 50 y.o.    DISCHARGE SUMMARY  ADMISSION DATE:    08/27/2015 DISCHARGE DATE:   08/28/2015   ADMISSION DIAGNOSIS: primary osteoarthritis left knee    DISCHARGE DIAGNOSIS:  primary osteoarthritis left knee    ADDITIONAL DIAGNOSIS: Active Problems:   S/P total knee replacement  Past Medical History  Diagnosis Date  . Obesity   . Degenerative arthritis of knee, bilateral   . RLL pneumonia 06/13/2014    PROCEDURE: Procedure(s): TOTAL KNEE ARTHROPLASTY on 08/27/2015  CONSULTS:     HISTORY:  See H&P in chart  HOSPITAL COURSE:  Dwayne Delgado is a 50 y.o. admitted on 08/27/2015 and found to have a diagnosis of primary osteoarthritis left knee.  After appropriate laboratory studies were obtained  they were taken to the operating room on 08/27/2015 and underwent Procedure(s): TOTAL KNEE ARTHROPLASTY.   They were given perioperative antibiotics:  Anti-infectives    Start     Dose/Rate Route Frequency Ordered Stop   08/27/15 2200  terbinafine (LAMISIL) tablet 250 mg     250 mg Oral Daily at bedtime 08/27/15 1508     08/27/15 1600  ceFAZolin (ANCEF) IVPB 1 g/50 mL premix     1 g 100 mL/hr over 30 Minutes Intravenous Every 6 hours 08/27/15 1508 08/27/15 2210   08/27/15 0900  ceFAZolin (ANCEF) IVPB 2g/100 mL premix     2 g 200 mL/hr over 30 Minutes Intravenous To [facility] 08/26/15 1456 08/27/15 1042    .  Patient given tranexamic acid IV or topical and exparel intra-operatively.  Tolerated the procedure well.    POD# 1: Vital signs were stable.  Patient denied Chest pain, shortness of breath, or calf pain.  Patient was started on Lovenox 30 mg subcutaneously twice daily at 8am.  Consults to PT, OT, and care  management were made.  The patient was weight bearing as tolerated.  CPM was placed on the operative leg 0-90 degrees for 6-8 hours a day. When out of the CPM, patient was placed in the foam block to achieve full extension. Incentive spirometry was taught.  Dressing was changed.       POD #2, Continued  PT for ambulation and exercise program.  IV saline locked.  O2 discontinued.    The remainder of the hospital course was dedicated to ambulation and strengthening.   The patient was discharged on 1 Day Post-Op in  Good condition.  Blood products given:none  DIAGNOSTIC STUDIES: Recent vital signs: Patient Vitals for the past 24 hrs:  BP Temp Temp src Pulse Resp SpO2  08/28/15 0500 122/72 mmHg 98 F (36.7 C) Oral 90 18 99 %  08/28/15 0044 120/79 mmHg 98.1 F (36.7 C) Oral 89 18 99 %  08/27/15 2101 120/81 mmHg 98.2 F (36.8 C) Oral 88 18 95 %  08/27/15 1500 126/81 mmHg 98.9 F (37.2 C) - 95 18 100 %  08/27/15 1415 128/87 mmHg - - 81 13 96 %  08/27/15 1400 - - - 77 20 -  08/27/15 1345  127/74 mmHg - - 73 - 97 %  08/27/15 1330 132/82 mmHg - - 76 - 98 %  08/27/15 1315 138/86 mmHg - - 77 18 98 %  08/27/15 1300 128/88 mmHg 97.1 F (36.2 C) - 76 16 97 %       Recent laboratory studies:  Recent Labs  08/27/15 1513 08/28/15 0425  WBC 13.3* 12.1*  HGB 13.1 12.1*  HCT 39.9 37.0*  PLT 198 275    Recent Labs  08/27/15 1513 08/28/15 0425  NA  --  138  K  --  4.2  CL  --  107  CO2  --  23  BUN  --  13  CREATININE 0.95 0.82  GLUCOSE  --  153*  CALCIUM  --  8.8*   Lab Results  Component Value Date   INR 1.09 08/16/2015     Recent Radiographic Studies :  No results found.  DISCHARGE INSTRUCTIONS: Discharge Instructions    CPM    Complete by:  As directed   Continuous passive motion machine (CPM):      Use the CPM from 0 to 90 for 4-6 hours per day.      You may increase by 10 per day.  You may break it up into 2 or 3 sessions per day.      Use CPM for 2 weeks or until  you are told to stop.     Call MD / Call 911    Complete by:  As directed   If you experience chest pain or shortness of breath, CALL 911 and be transported to the hospital emergency room.  If you develope a fever above 101 F, pus (white drainage) or increased drainage or redness at the wound, or calf pain, call your surgeon's office.     Change dressing    Complete by:  As directed   Change dressing on tomorrow, then change the dressing daily with sterile 4 x 4 inch gauze dressing and apply TED hose.  You may clean the incision with alcohol prior to redressing.     Constipation Prevention    Complete by:  As directed   Drink plenty of fluids.  Prune juice may be helpful.  You may use a stool softener, such as Colace (over the counter) 100 mg twice a day.  Use MiraLax (over the counter) for constipation as needed.     Diet - low sodium heart healthy    Complete by:  As directed      Discharge instructions    Complete by:  As directed   INSTRUCTIONS AFTER JOINT REPLACEMENT   Remove items at home which could result in a fall. This includes throw rugs or furniture in walking pathways ICE to the affected joint every three hours while awake for 30 minutes at a time, for at least the first 3-5 days, and then as needed for pain and swelling.  Continue to use ice for pain and swelling. You may notice swelling that will progress down to the foot and ankle.  This is normal after surgery.  Elevate your leg when you are not up walking on it.   Continue to use the breathing machine you got in the hospital (incentive spirometer) which will help keep your temperature down.  It is common for your temperature to cycle up and down following surgery, especially at night when you are not up moving around and exerting yourself.  The breathing machine keeps your lungs expanded and your temperature  down.   DIET:  As you were doing prior to hospitalization, we recommend a well-balanced diet.  DRESSING / WOUND CARE /  SHOWERING  You may change your dressing 3-5 days after surgery.  Then change the dressing every day with sterile gauze.  Please use good hand washing techniques before changing the dressing.  Do not use any lotions or creams on the incision until instructed by your surgeon.  ACTIVITY  Increase activity slowly as tolerated, but follow the weight bearing instructions below.   No driving for 6 weeks or until further direction given by your physician.  You cannot drive while taking narcotics.  No lifting or carrying greater than 10 lbs. until further directed by your surgeon. Avoid periods of inactivity such as sitting longer than an hour when not asleep. This helps prevent blood clots.  You may return to work once you are authorized by your doctor.     WEIGHT BEARING   Weight bearing as tolerated with assist device (walker, cane, etc) as directed, use it as long as suggested by your surgeon or therapist, typically at least 4-6 weeks.   EXERCISES  Results after joint replacement surgery are often greatly improved when you follow the exercise, range of motion and muscle strengthening exercises prescribed by your doctor. Safety measures are also important to protect the joint from further injury. Any time any of these exercises cause you to have increased pain or swelling, decrease what you are doing until you are comfortable again and then slowly increase them. If you have problems or questions, call your caregiver or physical therapist for advice.   Rehabilitation is important following a joint replacement. After just a few days of immobilization, the muscles of the leg can become weakened and shrink (atrophy).  These exercises are designed to build up the tone and strength of the thigh and leg muscles and to improve motion. Often times heat used for twenty to thirty minutes before working out will loosen up your tissues and help with improving the range of motion but do not use heat for the first  two weeks following surgery (sometimes heat can increase post-operative swelling).   These exercises can be done on a training (exercise) mat, on the floor, on a table or on a bed. Use whatever works the best and is most comfortable for you.    Use music or television while you are exercising so that the exercises are a pleasant break in your day. This will make your life better with the exercises acting as a break in your routine that you can look forward to.   Perform all exercises about fifteen times, three times per day or as directed.  You should exercise both the operative leg and the other leg as well.   Exercises include:   Quad Sets - Tighten up the muscle on the front of the thigh (Quad) and hold for 5-10 seconds.   Straight Leg Raises - With your knee straight (if you were given a brace, keep it on), lift the leg to 60 degrees, hold for 3 seconds, and slowly lower the leg.  Perform this exercise against resistance later as your leg gets stronger.  Leg Slides: Lying on your back, slowly slide your foot toward your buttocks, bending your knee up off the floor (only go as far as is comfortable). Then slowly slide your foot back down until your leg is flat on the floor again.  Angel Wings: Lying on your back spread your legs  to the side as far apart as you can without causing discomfort.  Hamstring Strength:  Lying on your back, push your heel against the floor with your leg straight by tightening up the muscles of your buttocks.  Repeat, but this time bend your knee to a comfortable angle, and push your heel against the floor.  You may put a pillow under the heel to make it more comfortable if necessary.   A rehabilitation program following joint replacement surgery can speed recovery and prevent re-injury in the future due to weakened muscles. Contact your doctor or a physical therapist for more information on knee rehabilitation.    CONSTIPATION  Constipation is defined medically as fewer  than three stools per week and severe constipation as less than one stool per week.  Even if you have a regular bowel pattern at home, your normal regimen is likely to be disrupted due to multiple reasons following surgery.  Combination of anesthesia, postoperative narcotics, change in appetite and fluid intake all can affect your bowels.   YOU MUST use at least one of the following options; they are listed in order of increasing strength to get the job done.  They are all available over the counter, and you may need to use some, POSSIBLY even all of these options:    Drink plenty of fluids (prune juice may be helpful) and high fiber foods Colace 100 mg by mouth twice a day  Senokot for constipation as directed and as needed Dulcolax (bisacodyl), take with full glass of water  Miralax (polyethylene glycol) once or twice a day as needed.  If you have tried all these things and are unable to have a bowel movement in the first 3-4 days after surgery call either your surgeon or your primary doctor.    If you experience loose stools or diarrhea, hold the medications until you stool forms back up.  If your symptoms do not get better within 1 week or if they get worse, check with your doctor.  If you experience "the worst abdominal pain ever" or develop nausea or vomiting, please contact the office immediately for further recommendations for treatment.   ITCHING:  If you experience itching with your medications, try taking only a single pain pill, or even half a pain pill at a time.  You can also use Benadryl over the counter for itching or also to help with sleep.   TED HOSE STOCKINGS:  Use stockings on both legs until for at least 2 weeks or as directed by physician office. They may be removed at night for sleeping.  MEDICATIONS:  See your medication summary on the "After Visit Summary" that nursing will review with you.  You may have some home medications which will be placed on hold until you complete  the course of blood thinner medication.  It is important for you to complete the blood thinner medication as prescribed.  PRECAUTIONS:  If you experience chest pain or shortness of breath - call 911 immediately for transfer to the hospital emergency department.   If you develop a fever greater that 101 F, purulent drainage from wound, increased redness or drainage from wound, foul odor from the wound/dressing, or calf pain - CONTACT YOUR SURGEON.  FOLLOW-UP APPOINTMENTS:  If you do not already have a post-op appointment, please call the office for an appointment to be seen by your surgeon.  Guidelines for how soon to be seen are listed in your "After Visit Summary", but are typically between 1-4 weeks after surgery.  OTHER INSTRUCTIONS:   Knee Replacement:  Do not place pillow under knee, focus on keeping the knee straight while resting. CPM instructions: 0-90 degrees, 2 hours in the morning, 2 hours in the afternoon, and 2 hours in the evening. Place foam block, curve side up under heel at all times except when in CPM or when walking.  DO NOT modify, tear, cut, or change the foam block in any way.  MAKE SURE YOU:  Understand these instructions.  Get help right away if you are not doing well or get worse.    Thank you for letting us be a part of your medical care team.  It is a privilege we respect greatly.  We hope these instructions will help you stay on track for a fast and full recovery!     Driving restrictions    Complete by:  As directed   No driving for 4 weeks     Increase activity slowly as tolerated    Complete by:  As directed            DISCHARGE MEDICATIONS:     Medication List    STOP taking these medications        traMADol 50 MG tablet  Commonly known as:  ULTRAM      TAKE these medications        enoxaparin 40 MG/0.4ML injection  Commonly known as:  LOVENOX  Inject 0.4 mLs (40 mg total) into the skin daily.      leflunomide 20 MG tablet  Commonly known as:  ARAVA  Take 20 mg by mouth daily.     methocarbamol 500 MG tablet  Commonly known as:  ROBAXIN  Take 1-2 tablets (500-1,000 mg total) by mouth every 6 (six) hours as needed for muscle spasms.     MULTIVITAMIN PO  Take 1 tablet by mouth daily.     ondansetron 4 MG tablet  Commonly known as:  ZOFRAN  Take 1 tablet (4 mg total) by mouth every 6 (six) hours as needed for nausea.     oxyCODONE 5 MG immediate release tablet  Commonly known as:  Oxy IR/ROXICODONE  Take 1-2 tablets (5-10 mg total) by mouth every 3 (three) hours as needed for breakthrough pain.     oxyCODONE 10 mg 12 hr tablet  Commonly known as:  OXYCONTIN  Take 1 tablet (10 mg total) by mouth every 12 (twelve) hours.     terbinafine 1 % cream  Commonly known as:  LAMISIL  Apply 1 application topically daily.     terbinafine 250 MG tablet  Commonly known as:  LAMISIL  Take 250 mg by mouth at bedtime.        FOLLOW UP VISIT:       Follow-up Information    Follow up with [facility].   Why:  They will contact you to schedule home therapy visits.    Contact information:   [address] [place] [address] [phone]       Follow up with Rudean Haskell, MD. Call on 09/11/2015.   Specialty:  Orthopedic Surgery   Contact information:   [place] [address] [phone]  DISPOSITION: HOME VS. SNF  CONDITION:  Good   Donia Ast 08/28/2015, 12:38 PM

## 2015-08-28 NOTE — Progress Notes (Signed)
Reviewed discharge instructions with patient.  He does not have any questions.  He is waiting for his wife to pick him up. Midge Minium 08/28/2015 3:27 PM

## 2015-08-28 NOTE — Progress Notes (Signed)
Physical Therapy Treatment Patient Details Name: Dwayne Delgado MRN: [id number] DOB: 03-10-1966 Today's Date: 08/28/2015    History of Present Illness Pt is a 50 y.o. male now s/p Lt TKA. PMH: knee arthroscopy, obesity    PT Comments    Pt making great progress with PT regarding mobility and activity progression. Pt hoping to be able to D/C to home today. Will need to attempt stairs at next session.   Follow Up Recommendations  Home health PT;Supervision - Intermittent     Equipment Recommendations  None recommended by PT    Recommendations for Other Services       Precautions / Restrictions Precautions Precautions: Knee;Fall Restrictions Weight Bearing Restrictions: Yes LLE Weight Bearing: Weight bearing as tolerated    Mobility  Bed Mobility Overal bed mobility: Needs Assistance Bed Mobility: Supine to Sit     Supine to sit: Supervision        Transfers Overall transfer level: Needs assistance Equipment used: Rolling walker (2 wheeled) Transfers: Sit to/from Stand Sit to Stand: Supervision         General transfer comment: safe technique utilized  Ambulation/Gait Ambulation/Gait assistance: Supervision Ambulation Distance (Feet): 175 Feet Assistive device: Rolling walker (2 wheeled) Gait Pattern/deviations: Step-through pattern Gait velocity: decreased   General Gait Details: cues for lt knee flexion with swing phase    Stairs            Wheelchair Mobility    Modified Rankin (Stroke Patients Only)       Balance Overall balance assessment: Needs assistance Sitting-balance support: No upper extremity supported Sitting balance-Leahy Scale: Normal     Standing balance support: During functional activity Standing balance-Leahy Scale: Fair Standing balance comment: using rw for ambulation                    Cognition Arousal/Alertness: Awake/alert Behavior During Therapy: WFL for tasks assessed/performed Overall Cognitive  Status: Within Functional Limits for tasks assessed                      Exercises Total Joint Exercises Ankle Circles/Pumps: AROM;Both;10 reps Quad Sets: Strengthening;10 reps;Left Short Arc Quad: Strengthening;Left;10 reps Heel Slides: AAROM;Left;10 reps Hip ABduction/ADduction: Strengthening;Left;5 reps Straight Leg Raises: Strengthening;Left;5 reps Long Arc Quad: Strengthening;Left;10 reps Knee Flexion: AAROM;Left;10 reps Goniometric ROM: approx. 90 degrees knee flexion    General Comments        Pertinent Vitals/Pain Pain Assessment: 0-10 Pain Score: 5  Pain Location: Lt knee Pain Descriptors / Indicators: Sore Pain Intervention(s): Monitored during session;Limited activity within patient's tolerance    Home Living                      Prior Function            PT Goals (current goals can now be found in the care plan section) Acute Rehab PT Goals Patient Stated Goal: be able to play with kids PT Goal Formulation: With patient Time For Goal Achievement: 09/10/15 Potential to Achieve Goals: Good Progress towards PT goals: Progressing toward goals    Frequency  7X/week    PT Plan Current plan remains appropriate    Co-evaluation             End of Session Equipment Utilized During Treatment: Gait belt Activity Tolerance: Patient tolerated treatment well Patient left: in chair;with call bell/phone within reach (in knee extension)     Time: [id number] PT Time Calculation (min) (ACUTE ONLY): 26 min  Charges:  $Gait Training: 8-22 mins $Therapeutic Exercise: 8-22 mins                    G Codes:      Cassell Clement, PT, CSCS Pager [phone] Office [phone]  08/28/2015, 10:14 AM

## 2015-08-28 NOTE — Care Management Note (Signed)
Case Management Note  Patient Details  Name: Hussein Spillman MRN: [id number] Date of Birth: 08-09-1965  Subjective/Objective:                 S/p left total knee arthroplasty   Action/Plan: Set up with [facility] for HHPT by MD office. Spoke with patient, no change in discharge plan. [employer] has delivered CPM, rolling walker and 3N1 to home. Patient stated that his wife and mother will be assisting him after discharge.  Expected Discharge Date:                  Expected Discharge Plan:  [place]  In-House Referral:  NA  Discharge planning Services  CM Consult  Post Acute Care Choice:  Durable Medical Equipment, Home Health Choice offered to:  Patient  DME Arranged:  3-N-1, CPM, Walker rolling DME Agency:  [employer]  HH Arranged:  PT HH Agency:  [facility]  Status of Service:  Completed, signed off  Medicare Important Message Given:    Date Medicare IM Given:    Medicare IM give by:    Date Additional Medicare IM Given:    Additional Medicare Important Message give by:     If discussed at [place] of Stay Meetings, dates discussed:    Additional Comments:  Nila Nephew, RN 08/28/2015, 11:32 AM

## 2015-08-28 NOTE — Op Note (Signed)
TOTAL KNEE REPLACEMENT OPERATIVE NOTE:  08/27/2015  5:56 PM  PATIENT:  Dwayne Delgado  50 y.o. male  PRE-OPERATIVE DIAGNOSIS:  primary osteoarthritis left knee  POST-OPERATIVE DIAGNOSIS:  primary osteoarthritis left knee  PROCEDURE:  Procedure(s): TOTAL KNEE ARTHROPLASTY  SURGEON:  Surgeon(s): Vickey Huger, MD  PHYSICIAN ASSISTANT: Carlyon Shadow, [facility]  ANESTHESIA:   spinal  DRAINS: Hemovac  SPECIMEN: None  COUNTS:  Correct  TOURNIQUET:   Total Tourniquet Time Documented: Thigh (Left) - 63 minutes Total: Thigh (Left) - 63 minutes   DICTATION:  Indication for procedure:    The patient is a 50 y.o. male who has failed conservative treatment for primary osteoarthritis left knee.  Informed consent was obtained prior to anesthesia. The risks versus benefits of the operation were explain and in a way the patient can, and did, understand.   On the implant demand matching protocol, this patient scored 10.  Therefore, this patient did" "did not receive a polyethylene insert with vitamin E which is a high demand implant.  Description of procedure:     The patient was taken to the operating room and placed under anesthesia.  The patient was positioned in the usual fashion taking care that all body parts were adequately padded and/or protected.  I foley catheter was not placed.  A tourniquet was applied and the leg prepped and draped in the usual sterile fashion.  The extremity was exsanguinated with the esmarch and tourniquet inflated to 350 mmHg.  Pre-operative range of motion was normal.  The knee was in 5 degree of mild varus.  A midline incision approximately 6-7 inches long was made with a #10 blade.  A new blade was used to make a parapatellar arthrotomy going 2-3 cm into the quadriceps tendon, over the patella, and alongside the medial aspect of the patellar tendon.  A synovectomy was then performed with the #10 blade and forceps. I then elevated the deep MCL off the medial  tibial metaphysis subperiosteally around to the semimembranosus attachment.    I everted the patella and used calipers to measure patellar thickness.  I used the reamer to ream down to appropriate thickness to recreate the native thickness.  I then removed excess bone with the rongeur and sagittal saw.  I used the appropriately sized template and drilled the three lug holes.  I then put the trial in place and measured the thickness with the calipers to ensure recreation of the native thickness.  The trial was then removed and the patella subluxed and the knee brought into flexion.  A homan retractor was place to retract and protect the patella and lateral structures.  A Z-retractor was place medially to protect the medial structures.  The extra-medullary alignment system was used to make cut the tibial articular surface perpendicular to the anamotic axis of the tibia and in 3 degrees of posterior slope.  The cut surface and alignment jig was removed.  I then used the PSI Depuy cutting block to make the cut.  I then used the PSI DePuy block to make the distal femoral cut.  I also used the disposable size-specific instruments to finish my femur and tibia.  A lamina spreader was placed in 90 degrees of flexion.  The ACL, PCL, menisci, and posterior condylar osteophytes were removed.  A 10 mm spacer blocked was found to offer good flexion and extension gap balance after minimal in degree releasing.   The scoop retractor was then placed and the femoral finishing block was pinned in  place.  The small sagittal saw was used as well as the lug drill to finish the femur.  The block and cut surfaces were removed and the medullary canal hole filled with autograft bone from the cut pieces.  The tibia was delivered forward in deep flexion and external rotation.  A size 3 was selected and pinned into place centered on the medial 1/3 of the tibial tubercle.  The reamer and keel was used to prepare the tibia through the  tray.    I then trialed with the size 7 femur, size 3 tibia, a 10 mm insert and the 32 patella.  I had excellent flexion/extension gap balance, excellent patella tracking.  Flexion was full and beyond 120 degrees; extension was zero.  These components were chosen and the staff opened them to me on the back table while the knee was lavaged copiously and the cement mixed.  The soft tissue was infiltrated with 60cc of exparel 1.3% through a 21 gauge needle.  I cemented in the components and removed all excess cement.  The polyethylene tibial component was snapped into place and the knee placed in extension while cement was hardening.  The capsule was infilltrated with 30cc of .25% Marcaine with epinephrine.  A hemovac was place in the joint exiting superolaterally.  A pain pump was place superomedially superficial to the arthrotomy.  Once the cement was hard, the tourniquet was let down.  Hemostasis was obtained.  The arthrotomy was closed with figure-8 #1 vicryl sutures.  The deep soft tissues were closed with #0 vicryls and the subcuticular layer closed with a running #2-0 vicryl.  The skin was reapproximated and closed with skin staples.  The wound was dressed with xeroform, 4 x4's, 2 ABD sponges, a single layer of webril and a TED stocking.   The patient was then awakened, extubated, and taken to the recovery room in stable condition.  BLOOD LOSS:  300cc DRAINS: 1 hemovac, 1 pain catheter COMPLICATIONS:  None.  PLAN OF CARE: Admit to inpatient   PATIENT DISPOSITION:  PACU - hemodynamically stable.   Delay start of Pharmacological VTE agent (>24hrs) due to surgical blood loss or risk of bleeding:  not applicable  Please fax a copy of this op note to my office at [phone] (please only include page 1 and 2 of the Case Information op note)

## 2015-08-28 NOTE — Progress Notes (Signed)
SPORTS MEDICINE AND JOINT REPLACEMENT  Lara Mulch, MD   [facility] PA-C [place], [place], [place]  [address]                             [phone]   PROGRESS NOTE  Subjective:  negative for Chest Pain  negative for Shortness of Breath  negative for Nausea/Vomiting   negative for Calf Pain  negative for Bowel Movement   Tolerating Diet: yes         Patient reports pain as 4 on 0-10 scale.    Objective: Vital signs in last 24 hours:   Patient Vitals for the past 24 hrs:  BP Temp Temp src Pulse Resp SpO2 Height Weight  08/28/15 0500 122/72 mmHg 98 F (36.7 C) Oral 90 18 99 % - -  08/28/15 0044 120/79 mmHg 98.1 F (36.7 C) Oral 89 18 99 % - -  08/27/15 2101 120/81 mmHg 98.2 F (36.8 C) Oral 88 18 95 % - -  08/27/15 1500 126/81 mmHg 98.9 F (37.2 C) - 95 18 100 % - -  08/27/15 1415 128/87 mmHg - - 81 13 96 % - -  08/27/15 1400 - - - 77 20 - - -  08/27/15 1345 127/74 mmHg - - 73 - 97 % - -  08/27/15 1330 132/82 mmHg - - 76 - 98 % - -  08/27/15 1315 138/86 mmHg - - 77 18 98 % - -  08/27/15 1300 128/88 mmHg 97.1 F (36.2 C) - 76 16 97 % - -  08/27/15 0803 (!) 137/101 mmHg 97.7 F (36.5 C) Oral 74 20 97 % 5\' 9"  (1.753 m) 99.791 kg (220 lb)    @flow {1959:LAST@   Intake/Output from previous day:   05/01 0701 - 05/02 0700 In: 4093.8 [P.O.:555; I.V.:3328.8] Out: 225 [Urine:175]   Intake/Output this shift:       Intake/Output      05/01 0701 - 05/02 0700 05/02 0701 - 05/03 0700   P.O. 555    I.V. (mL/kg) 3328.8 (33.4)    IV Piggyback 210    Total Intake(mL/kg) 4093.8 (41)    Urine (mL/kg/hr) 175    Blood 50    Total Output 225     Net +3868.8          Urine Occurrence 2 x       LABORATORY DATA:  Recent Labs  08/27/15 1513 08/28/15 0425  WBC 13.3* 12.1*  HGB 13.1 12.1*  HCT 39.9 37.0*  PLT 198 275    Recent Labs  08/27/15 1513 08/28/15 0425  NA  --  138  K  --  4.2  CL  --  107  CO2  --  23  BUN  --  13  CREATININE 0.95 0.82   GLUCOSE  --  153*  CALCIUM  --  8.8*   Lab Results  Component Value Date   INR 1.09 08/16/2015    Examination:  General appearance: alert, cooperative and no distress Extremities: extremities normal, atraumatic, no cyanosis or edema  Wound Exam: clean, dry, intact   Drainage:  None: wound tissue dry  Motor Exam: Quadriceps and Hamstrings Intact  Sensory Exam: Superficial Peroneal, Deep Peroneal and Tibial normal   Assessment:    1 Day Post-Op  Procedure(s) (LRB): TOTAL KNEE ARTHROPLASTY (Left)  ADDITIONAL DIAGNOSIS:  Active Problems:   S/P total knee replacement  Acute Blood Loss Anemia   Plan:  Physical Therapy as ordered Weight Bearing as Tolerated (WBAT)  DVT Prophylaxis:  Lovenox  DISCHARGE PLAN: Home  DISCHARGE NEEDS: HHPT   Patient looks great. Will D/C today         Donia Ast 08/28/2015, 7:09 AM

## 2015-08-28 NOTE — Progress Notes (Signed)
Physical Therapy Treatment Patient Details Name: Dwayne Delgado MRN: [id number] DOB: 1965/12/17 Today's Date: 08/28/2015    History of Present Illness Pt is a 50 y.o. male now s/p Lt TKA. PMH: knee arthroscopy, obesity    PT Comments    Patient is making good progress with PT.  From a mobility standpoint anticipate patient will be ready for DC home with family support.      Follow Up Recommendations  Home health PT;Supervision - Intermittent     Equipment Recommendations  None recommended by PT    Recommendations for Other Services       Precautions / Restrictions Precautions Precautions: Knee;Fall Restrictions Weight Bearing Restrictions: Yes LLE Weight Bearing: Weight bearing as tolerated    Mobility  Bed Mobility Overal bed mobility: Needs Assistance Bed Mobility: Supine to Sit     Supine to sit: Supervision     General bed mobility comments: up in chair upon arrival  Transfers Overall transfer level: Independent Equipment used: None Transfers: Sit to/from Stand Sit to Stand: Independent         General transfer comment: able to stand without assistance safely  Ambulation/Gait Ambulation/Gait assistance: Supervision Ambulation Distance (Feet): 200 Feet Assistive device: Rolling walker (2 wheeled) Gait Pattern/deviations: Step-through pattern Gait velocity: decreased   General Gait Details: cues for lt knee flexion with swing phase    Stairs Stairs: Yes Stairs assistance: Supervision Stair Management: One rail Right;Step to pattern;Forwards Number of Stairs: 12 General stair comments: reminder for sequence, safe pattern.   Wheelchair Mobility    Modified Rankin (Stroke Patients Only)       Balance Overall balance assessment: Needs assistance Sitting-balance support: No upper extremity supported Sitting balance-Leahy Scale: Normal     Standing balance support: No upper extremity supported Standing balance-Leahy Scale: Good Standing  balance comment: able to stand without assist.                     Cognition Arousal/Alertness: Awake/alert Behavior During Therapy: WFL for tasks assessed/performed Overall Cognitive Status: Within Functional Limits for tasks assessed                      Exercises Total Joint Exercises Ankle Circles/Pumps: AROM;Both;10 reps Quad Sets: Strengthening;10 reps;Left Short Arc Quad: Strengthening;Left;10 reps Heel Slides: AAROM;Left;10 reps Hip ABduction/ADduction: Strengthening;Left;5 reps Straight Leg Raises: Strengthening;Left;5 reps Long Arc Quad: Strengthening;Left;10 reps Knee Flexion: AAROM;Left;10 reps Goniometric ROM: approx. 90 degrees knee flexion    General Comments        Pertinent Vitals/Pain Pain Assessment: 0-10 Pain Score: 3  Pain Location: Lt knee Pain Descriptors / Indicators: Sore Pain Intervention(s): Monitored during session    Home Living Family/patient expects to be discharged to:: Private residence Living Arrangements: Spouse/significant other;Non-relatives/Friends Available Help at Discharge: Family;Available PRN/intermittently Type of Home: House Home Access: Stairs to enter Entrance Stairs-Rails: None Home Layout: Two level;Able to live on main level with bedroom/bathroom;Other (Comment) (1/2 bath on main level, will stay on couch for a few days) Home Equipment: Walker - 2 wheels;Shower seat;Bedside commode      Prior Function Level of Independence: Independent      Comments: Served in [employer] for 10 years   PT Goals (current goals can now be found in the care plan section) Acute Rehab PT Goals Patient Stated Goal: to home today PT Goal Formulation: With patient Time For Goal Achievement: 09/10/15 Potential to Achieve Goals: Good Progress towards PT goals: Progressing toward goals    Frequency  7X/week    PT Plan Current plan remains appropriate    Co-evaluation             End of Session Equipment Utilized  During Treatment: Gait belt Activity Tolerance: Patient tolerated treatment well Patient left: in chair;with call bell/phone within reach     Time: 1201-1213 PT Time Calculation (min) (ACUTE ONLY): 12 min  Charges:  $Gait Training: 8-22 mins $Therapeutic Exercise: 8-22 mins                    G Codes:      Cassell Clement, PT, CSCS Pager [phone] Office [phone]  08/28/2015, 12:43 PM

## 2015-08-29 ENCOUNTER — Encounter (HOSPITAL_COMMUNITY): Payer: Self-pay | Admitting: Orthopedic Surgery

## 2015-08-31 ENCOUNTER — Encounter (HOSPITAL_COMMUNITY): Payer: Self-pay | Admitting: Orthopedic Surgery

## 2015-09-26 ENCOUNTER — Other Ambulatory Visit: Payer: Self-pay | Admitting: Orthopedic Surgery

## 2015-10-31 NOTE — Pre-Procedure Instructions (Signed)
Tayvian Salgueiro  10/31/2015     Your procedure is scheduled on : Monday November 12, 2015 at 8:40 AM.  Report to [facility] Admitting at 6:40 AM.  Call this number if you have problems the morning of surgery: [phone]    Remember:  Do not eat food or drink liquids after midnight.  Take these medicines the morning of surgery with A SIP OF WATER: Oxycodone if needed   Stop taking any vitamins,herbal medications/supplements, NSAIDs, Ibuprofen, Advil, Motrin, Aleve, etc on Monday July 10th   Do not wear jewelry.  Do not wear lotions, powders, or cologne.    Men may shave face and neck.  Do not bring valuables to the hospital.  [facility] is not responsible for any belongings or valuables.  Contacts, dentures or bridgework may not be worn into surgery.  Leave your suitcase in the car.  After surgery it may be brought to your room.  For patients admitted to the hospital, discharge time will be determined by your treatment team.  Patients discharged the day of surgery will not be allowed to drive home.   Name and phone number of your driver:    Special instructions:  Shower using CHG soap the night before and the morning of your surgery  Please read over the following fact sheets that you were given. MRSA Information

## 2015-11-01 ENCOUNTER — Ambulatory Visit (HOSPITAL_COMMUNITY)
Admission: RE | Admit: 2015-11-01 | Discharge: 2015-11-01 | Disposition: A | Discharge status: Home / Self Care | Payer: No Typology Code available for payment source | Source: Ambulatory Visit | Attending: Orthopedic Surgery | Admitting: Orthopedic Surgery

## 2015-11-01 ENCOUNTER — Encounter (HOSPITAL_COMMUNITY): Payer: Self-pay

## 2015-11-01 ENCOUNTER — Encounter (HOSPITAL_COMMUNITY)
Admission: RE | Admit: 2015-11-01 | Discharge: 2015-11-01 | Disposition: A | Discharge status: Home / Self Care | Payer: No Typology Code available for payment source | Source: Ambulatory Visit | Attending: Orthopedic Surgery | Admitting: Orthopedic Surgery

## 2015-11-01 DIAGNOSIS — Z01812 Encounter for preprocedural laboratory examination: Secondary | ICD-10-CM | POA: Insufficient documentation

## 2015-11-01 DIAGNOSIS — Z01818 Encounter for other preprocedural examination: Secondary | ICD-10-CM | POA: Diagnosis present

## 2015-11-01 LAB — COMPREHENSIVE METABOLIC PANEL
ALT: 25 U/L (ref 17–63)
AST: 23 U/L (ref 15–41)
Albumin: 3.8 g/dL (ref 3.5–5.0)
Alkaline Phosphatase: 92 U/L (ref 38–126)
Anion gap: 5 (ref 5–15)
BILIRUBIN TOTAL: 0.5 mg/dL (ref 0.3–1.2)
BUN: 12 mg/dL (ref 6–20)
CHLORIDE: 111 mmol/L (ref 101–111)
CO2: 25 mmol/L (ref 22–32)
CREATININE: 0.85 mg/dL (ref 0.61–1.24)
Calcium: 9.7 mg/dL (ref 8.9–10.3)
Glucose, Bld: 117 mg/dL — ABNORMAL HIGH (ref 65–99)
Potassium: 3.9 mmol/L (ref 3.5–5.1)
Sodium: 141 mmol/L (ref 135–145)
TOTAL PROTEIN: 7 g/dL (ref 6.5–8.1)

## 2015-11-01 LAB — CBC WITH DIFFERENTIAL/PLATELET
BASOS ABS: 0 10*3/uL (ref 0.0–0.1)
BASOS PCT: 1 %
Eosinophils Absolute: 0.3 10*3/uL (ref 0.0–0.7)
Eosinophils Relative: 4 %
HEMATOCRIT: 45.9 % (ref 39.0–52.0)
HEMOGLOBIN: 14.6 g/dL (ref 13.0–17.0)
LYMPHS PCT: 28 %
Lymphs Abs: 2.4 10*3/uL (ref 0.7–4.0)
MCH: 26.1 pg (ref 26.0–34.0)
MCHC: 31.8 g/dL (ref 30.0–36.0)
MCV: 82 fL (ref 78.0–100.0)
MONOS PCT: 12 %
Monocytes Absolute: 1 10*3/uL (ref 0.1–1.0)
NEUTROS ABS: 4.8 10*3/uL (ref 1.7–7.7)
NEUTROS PCT: 55 %
PLATELETS: 314 10*3/uL (ref 150–400)
RBC: 5.6 MIL/uL (ref 4.22–5.81)
RDW: 13.5 % (ref 11.5–15.5)
WBC: 8.5 10*3/uL (ref 4.0–10.5)

## 2015-11-01 LAB — URINALYSIS, ROUTINE W REFLEX MICROSCOPIC
BILIRUBIN URINE: NEGATIVE
GLUCOSE, UA: NEGATIVE mg/dL
HGB URINE DIPSTICK: NEGATIVE
KETONES UR: NEGATIVE mg/dL
LEUKOCYTES UA: NEGATIVE
Nitrite: NEGATIVE
PROTEIN: NEGATIVE mg/dL
Specific Gravity, Urine: 1.029 (ref 1.005–1.030)
pH: 5.5 (ref 5.0–8.0)

## 2015-11-01 LAB — SURGICAL PCR SCREEN
MRSA, PCR: NEGATIVE
STAPHYLOCOCCUS AUREUS: NEGATIVE

## 2015-11-01 LAB — PROTIME-INR
INR: 0.99 (ref 0.00–1.49)
PROTHROMBIN TIME: 13.3 s (ref 11.6–15.2)

## 2015-11-01 LAB — APTT: aPTT: 31 seconds (ref 24–37)

## 2015-11-02 LAB — URINE CULTURE: Culture: NO GROWTH

## 2015-11-09 MED ORDER — ACETAMINOPHEN 500 MG PO TABS
1000.0000 mg | ORAL_TABLET | Freq: Once | ORAL | Status: AC
  Administered 2015-11-12: 1000 mg via ORAL
  Filled 2015-11-09: qty 2

## 2015-11-09 MED ORDER — BUPIVACAINE LIPOSOME 1.3 % IJ SUSP
20.0000 mL | INTRAMUSCULAR | Status: AC
  Administered 2015-11-12: 20 mL
  Filled 2015-11-09: qty 20

## 2015-11-09 MED ORDER — CEFAZOLIN SODIUM-DEXTROSE 2-4 GM/100ML-% IV SOLN
2.0000 g | INTRAVENOUS | Status: AC
  Administered 2015-11-12: 2 g via INTRAVENOUS
  Filled 2015-11-09: qty 100

## 2015-11-09 MED ORDER — TRANEXAMIC ACID 1000 MG/10ML IV SOLN
1000.0000 mg | INTRAVENOUS | Status: AC
  Administered 2015-11-12: 1000 mg via INTRAVENOUS
  Filled 2015-11-09: qty 10

## 2015-11-12 ENCOUNTER — Inpatient Hospital Stay (HOSPITAL_COMMUNITY)
Admission: RE | Admit: 2015-11-12 | Discharge: 2015-11-13 | DRG: 470 | Disposition: A | Discharge status: Home Health | Payer: No Typology Code available for payment source | Source: Ambulatory Visit | Attending: Orthopedic Surgery | Admitting: Orthopedic Surgery

## 2015-11-12 ENCOUNTER — Inpatient Hospital Stay (HOSPITAL_COMMUNITY): Payer: No Typology Code available for payment source | Admitting: Certified Registered Nurse Anesthetist

## 2015-11-12 ENCOUNTER — Encounter (HOSPITAL_COMMUNITY)
Admission: RE | Disposition: A | Discharge status: Home Health | Payer: Self-pay | Source: Ambulatory Visit | Attending: Orthopedic Surgery

## 2015-11-12 DIAGNOSIS — Z6834 Body mass index (BMI) 34.0-34.9, adult: Secondary | ICD-10-CM | POA: Diagnosis not present

## 2015-11-12 DIAGNOSIS — M1711 Unilateral primary osteoarthritis, right knee: Principal | ICD-10-CM | POA: Diagnosis present

## 2015-11-12 DIAGNOSIS — E669 Obesity, unspecified: Secondary | ICD-10-CM | POA: Diagnosis present

## 2015-11-12 DIAGNOSIS — Z96652 Presence of left artificial knee joint: Secondary | ICD-10-CM | POA: Diagnosis present

## 2015-11-12 DIAGNOSIS — D62 Acute posthemorrhagic anemia: Secondary | ICD-10-CM | POA: Diagnosis not present

## 2015-11-12 DIAGNOSIS — Z96659 Presence of unspecified artificial knee joint: Secondary | ICD-10-CM

## 2015-11-12 HISTORY — PX: TOTAL KNEE ARTHROPLASTY: SHX125

## 2015-11-12 SURGERY — ARTHROPLASTY, KNEE, TOTAL
Anesthesia: Spinal | Site: Knee | Laterality: Right

## 2015-11-12 MED ORDER — ONDANSETRON HCL 4 MG PO TABS: 4.0000 mg | ORAL_TABLET | Freq: Four times a day (QID) | ORAL | Status: DC | PRN

## 2015-11-12 MED ORDER — METHOCARBAMOL 500 MG PO TABS
ORAL_TABLET | ORAL | Status: AC
  Filled 2015-11-12: qty 1

## 2015-11-12 MED ORDER — MENTHOL 3 MG MT LOZG
1.0000 | LOZENGE | OROMUCOSAL | Status: DC | PRN
Start: 2015-11-12 — End: 2015-11-13

## 2015-11-12 MED ORDER — PROPOFOL 10 MG/ML IV BOLUS
INTRAVENOUS | Status: DC | PRN
  Administered 2015-11-12: 10 mg via INTRAVENOUS
  Administered 2015-11-12: 20 mg via INTRAVENOUS

## 2015-11-12 MED ORDER — METOCLOPRAMIDE HCL 5 MG/ML IJ SOLN: 5.0000 mg | Freq: Three times a day (TID) | INTRAMUSCULAR | Status: DC | PRN

## 2015-11-12 MED ORDER — ALUM & MAG HYDROXIDE-SIMETH 200-200-20 MG/5ML PO SUSP: 30.0000 mL | ORAL | Status: DC | PRN

## 2015-11-12 MED ORDER — BUPIVACAINE IN DEXTROSE 0.75-8.25 % IT SOLN
INTRATHECAL | Status: DC | PRN
  Administered 2015-11-12: 15 mg via INTRATHECAL

## 2015-11-12 MED ORDER — ZOLPIDEM TARTRATE 5 MG PO TABS: 5.0000 mg | ORAL_TABLET | Freq: Every evening | ORAL | Status: DC | PRN

## 2015-11-12 MED ORDER — BUPIVACAINE-EPINEPHRINE (PF) 0.25% -1:200000 IJ SOLN
INTRAMUSCULAR | Status: AC
  Filled 2015-11-12: qty 30

## 2015-11-12 MED ORDER — DEXTROSE 5 % IV SOLN
10.0000 mg | INTRAVENOUS | Status: DC | PRN
  Administered 2015-11-12: 30 ug/min via INTRAVENOUS

## 2015-11-12 MED ORDER — METOCLOPRAMIDE HCL 5 MG PO TABS: 5.0000 mg | ORAL_TABLET | Freq: Three times a day (TID) | ORAL | Status: DC | PRN

## 2015-11-12 MED ORDER — FENTANYL CITRATE (PF) 100 MCG/2ML IJ SOLN
INTRAMUSCULAR | Status: DC | PRN
  Administered 2015-11-12 (×2): 25 ug via INTRAVENOUS

## 2015-11-12 MED ORDER — BUPIVACAINE-EPINEPHRINE (PF) 0.5% -1:200000 IJ SOLN
INTRAMUSCULAR | Status: AC
  Filled 2015-11-12: qty 30

## 2015-11-12 MED ORDER — HYDROMORPHONE HCL 1 MG/ML IJ SOLN
INTRAMUSCULAR | Status: AC
  Filled 2015-11-12: qty 1

## 2015-11-12 MED ORDER — PHENOL 1.4 % MT LIQD: 1.0000 | OROMUCOSAL | Status: DC | PRN

## 2015-11-12 MED ORDER — CEFAZOLIN IN D5W 1 GM/50ML IV SOLN
1.0000 g | Freq: Four times a day (QID) | INTRAVENOUS | Status: AC
  Administered 2015-11-12 (×2): 1 g via INTRAVENOUS
  Filled 2015-11-12 (×2): qty 50

## 2015-11-12 MED ORDER — MIDAZOLAM HCL 5 MG/5ML IJ SOLN
INTRAMUSCULAR | Status: DC | PRN
  Administered 2015-11-12: 2 mg via INTRAVENOUS

## 2015-11-12 MED ORDER — SODIUM CHLORIDE 0.9 % IJ SOLN
INTRAMUSCULAR | Status: DC | PRN
  Administered 2015-11-12: 20 mL

## 2015-11-12 MED ORDER — DEXAMETHASONE SODIUM PHOSPHATE 10 MG/ML IJ SOLN
10.0000 mg | Freq: Once | INTRAMUSCULAR | Status: AC
  Administered 2015-11-13: 10 mg via INTRAVENOUS
  Filled 2015-11-12: qty 1

## 2015-11-12 MED ORDER — SODIUM CHLORIDE 0.9 % IV SOLN
INTRAVENOUS | Status: DC
  Administered 2015-11-12: 22:00:00 via INTRAVENOUS

## 2015-11-12 MED ORDER — OXYCODONE HCL 5 MG PO TABS
5.0000 mg | ORAL_TABLET | ORAL | Status: DC | PRN
  Administered 2015-11-12: 10 mg via ORAL
  Administered 2015-11-12 (×2): 5 mg via ORAL
  Administered 2015-11-12 – 2015-11-13 (×6): 10 mg via ORAL
  Filled 2015-11-12 (×7): qty 2

## 2015-11-12 MED ORDER — ONDANSETRON HCL 4 MG/2ML IJ SOLN: 4.0000 mg | Freq: Four times a day (QID) | INTRAMUSCULAR | Status: DC | PRN

## 2015-11-12 MED ORDER — ASPIRIN EC 325 MG PO TBEC
325.0000 mg | DELAYED_RELEASE_TABLET | Freq: Two times a day (BID) | ORAL | Status: DC
  Administered 2015-11-12 – 2015-11-13 (×3): 325 mg via ORAL
  Filled 2015-11-12 (×3): qty 1

## 2015-11-12 MED ORDER — HYDROMORPHONE HCL 1 MG/ML IJ SOLN
0.2500 mg | INTRAMUSCULAR | Status: DC | PRN
  Administered 2015-11-12 (×4): 0.5 mg via INTRAVENOUS

## 2015-11-12 MED ORDER — CHLORHEXIDINE GLUCONATE 4 % EX LIQD: 60.0000 mL | Freq: Once | CUTANEOUS | Status: DC

## 2015-11-12 MED ORDER — BISACODYL 5 MG PO TBEC: 5.0000 mg | DELAYED_RELEASE_TABLET | Freq: Every day | ORAL | Status: DC | PRN

## 2015-11-12 MED ORDER — MIDAZOLAM HCL 2 MG/2ML IJ SOLN
INTRAMUSCULAR | Status: AC
  Filled 2015-11-12: qty 2

## 2015-11-12 MED ORDER — ARTIFICIAL TEARS OP OINT
TOPICAL_OINTMENT | OPHTHALMIC | Status: AC
  Filled 2015-11-12: qty 3.5

## 2015-11-12 MED ORDER — FLEET ENEMA 7-19 GM/118ML RE ENEM: 1.0000 | ENEMA | Freq: Once | RECTAL | Status: DC | PRN

## 2015-11-12 MED ORDER — METHOCARBAMOL 1000 MG/10ML IJ SOLN
500.0000 mg | Freq: Four times a day (QID) | INTRAVENOUS | Status: DC | PRN
  Filled 2015-11-12: qty 5

## 2015-11-12 MED ORDER — SENNOSIDES-DOCUSATE SODIUM 8.6-50 MG PO TABS: 1.0000 | ORAL_TABLET | Freq: Every evening | ORAL | Status: DC | PRN

## 2015-11-12 MED ORDER — ACETAMINOPHEN 650 MG RE SUPP: 650.0000 mg | Freq: Four times a day (QID) | RECTAL | Status: DC | PRN

## 2015-11-12 MED ORDER — HYDROXYCHLOROQUINE SULFATE 200 MG PO TABS
200.0000 mg | ORAL_TABLET | Freq: Two times a day (BID) | ORAL | Status: DC
  Administered 2015-11-12 – 2015-11-13 (×3): 200 mg via ORAL
  Filled 2015-11-12 (×3): qty 1

## 2015-11-12 MED ORDER — PROPOFOL 500 MG/50ML IV EMUL
INTRAVENOUS | Status: DC | PRN
  Administered 2015-11-12: 75 ug/kg/min via INTRAVENOUS

## 2015-11-12 MED ORDER — DIPHENHYDRAMINE HCL 12.5 MG/5ML PO ELIX: 12.5000 mg | ORAL_SOLUTION | ORAL | Status: DC | PRN

## 2015-11-12 MED ORDER — TRANEXAMIC ACID 1000 MG/10ML IV SOLN
1000.0000 mg | Freq: Once | INTRAVENOUS | Status: AC
  Administered 2015-11-12: 1000 mg via INTRAVENOUS
  Filled 2015-11-12: qty 10

## 2015-11-12 MED ORDER — HYDROMORPHONE HCL 1 MG/ML IJ SOLN
1.0000 mg | INTRAMUSCULAR | Status: DC | PRN
  Administered 2015-11-12: 1 mg via INTRAVENOUS
  Filled 2015-11-12: qty 1

## 2015-11-12 MED ORDER — PHENYLEPHRINE HCL 10 MG/ML IJ SOLN
INTRAMUSCULAR | Status: DC | PRN
  Administered 2015-11-12 (×6): 40 ug via INTRAVENOUS

## 2015-11-12 MED ORDER — SODIUM CHLORIDE 0.9 % IR SOLN
Status: DC | PRN
  Administered 2015-11-12: 1000 mL

## 2015-11-12 MED ORDER — PROPOFOL 10 MG/ML IV BOLUS
INTRAVENOUS | Status: AC
  Filled 2015-11-12: qty 20

## 2015-11-12 MED ORDER — ROCURONIUM BROMIDE 50 MG/5ML IV SOLN
INTRAVENOUS | Status: AC
  Filled 2015-11-12: qty 1

## 2015-11-12 MED ORDER — SODIUM CHLORIDE 0.9 % IR SOLN
Status: DC | PRN
  Administered 2015-11-12: 3000 mL

## 2015-11-12 MED ORDER — LIDOCAINE HCL (CARDIAC) 20 MG/ML IV SOLN
INTRAVENOUS | Status: DC | PRN
  Administered 2015-11-12: 60 mg via INTRATRACHEAL

## 2015-11-12 MED ORDER — ONDANSETRON HCL 4 MG/2ML IJ SOLN
INTRAMUSCULAR | Status: AC
  Filled 2015-11-12: qty 2

## 2015-11-12 MED ORDER — LEFLUNOMIDE 20 MG PO TABS
20.0000 mg | ORAL_TABLET | Freq: Every day | ORAL | Status: DC
  Administered 2015-11-12 – 2015-11-13 (×2): 20 mg via ORAL
  Filled 2015-11-12 (×2): qty 1

## 2015-11-12 MED ORDER — ACETAMINOPHEN 500 MG PO TABS
1000.0000 mg | ORAL_TABLET | Freq: Four times a day (QID) | ORAL | Status: AC
  Administered 2015-11-12 – 2015-11-13 (×3): 1000 mg via ORAL
  Filled 2015-11-12 (×4): qty 2

## 2015-11-12 MED ORDER — FENTANYL CITRATE (PF) 250 MCG/5ML IJ SOLN
INTRAMUSCULAR | Status: AC
  Filled 2015-11-12: qty 5

## 2015-11-12 MED ORDER — DOCUSATE SODIUM 100 MG PO CAPS
100.0000 mg | ORAL_CAPSULE | Freq: Two times a day (BID) | ORAL | Status: DC
  Administered 2015-11-12 – 2015-11-13 (×3): 100 mg via ORAL
  Filled 2015-11-12 (×3): qty 1

## 2015-11-12 MED ORDER — ACETAMINOPHEN 325 MG PO TABS: 650.0000 mg | ORAL_TABLET | Freq: Four times a day (QID) | ORAL | Status: DC | PRN

## 2015-11-12 MED ORDER — OXYCODONE HCL ER 10 MG PO T12A
10.0000 mg | EXTENDED_RELEASE_TABLET | Freq: Two times a day (BID) | ORAL | Status: DC
  Administered 2015-11-12 – 2015-11-13 (×2): 10 mg via ORAL
  Filled 2015-11-12 (×2): qty 1

## 2015-11-12 MED ORDER — BUPIVACAINE-EPINEPHRINE 0.5% -1:200000 IJ SOLN: INTRAMUSCULAR | Status: DC | PRN

## 2015-11-12 MED ORDER — PROMETHAZINE HCL 25 MG/ML IJ SOLN: 6.2500 mg | INTRAMUSCULAR | Status: DC | PRN

## 2015-11-12 MED ORDER — BUPIVACAINE HCL (PF) 0.25 % IJ SOLN
INTRAMUSCULAR | Status: AC
  Filled 2015-11-12: qty 30

## 2015-11-12 MED ORDER — OXYCODONE HCL 5 MG PO TABS
ORAL_TABLET | ORAL | Status: AC
  Filled 2015-11-12: qty 1

## 2015-11-12 MED ORDER — METHOCARBAMOL 500 MG PO TABS
500.0000 mg | ORAL_TABLET | Freq: Four times a day (QID) | ORAL | Status: DC | PRN
  Administered 2015-11-12 – 2015-11-13 (×4): 500 mg via ORAL
  Filled 2015-11-12 (×3): qty 1

## 2015-11-12 MED ORDER — BUPIVACAINE-EPINEPHRINE (PF) 0.25% -1:200000 IJ SOLN
INTRAMUSCULAR | Status: DC | PRN
  Administered 2015-11-12: 30 mL

## 2015-11-12 MED ORDER — LACTATED RINGERS IV SOLN
INTRAVENOUS | Status: DC
  Administered 2015-11-12: 08:00:00 via INTRAVENOUS

## 2015-11-12 SURGICAL SUPPLY — 65 items
BANDAGE ESMARK 6X9 LF (GAUZE/BANDAGES/DRESSINGS) ×1 IMPLANT
BLADE SAGITTAL 13X1.27X60 (BLADE) ×2 IMPLANT
BLADE SAW SGTL 83.5X18.5 (BLADE) ×2 IMPLANT
BLADE SURG 10 STRL SS (BLADE) ×2 IMPLANT
BNDG ESMARK 6X9 LF (GAUZE/BANDAGES/DRESSINGS) ×2
BOWL SMART MIX CTS (DISPOSABLE) ×2 IMPLANT
CAPT KNEE TOTAL 3 ×2 IMPLANT
CEMENT BONE SIMPLEX SPEEDSET (Cement) ×4 IMPLANT
COVER SURGICAL LIGHT HANDLE (MISCELLANEOUS) ×2 IMPLANT
CUFF TOURNIQUET SINGLE 34IN LL (TOURNIQUET CUFF) ×2 IMPLANT
DRAPE EXTREMITY T 121X128X90 (DRAPE) ×2 IMPLANT
DRAPE INCISE IOBAN 66X45 STRL (DRAPES) ×4 IMPLANT
DRAPE PROXIMA HALF (DRAPES) ×2 IMPLANT
DRAPE U-SHAPE 47X51 STRL (DRAPES) ×2 IMPLANT
DRSG ADAPTIC 3X8 NADH LF (GAUZE/BANDAGES/DRESSINGS) IMPLANT
DRSG AQUACEL AG ADV 3.5X14 (GAUZE/BANDAGES/DRESSINGS) ×2 IMPLANT
DRSG PAD ABDOMINAL 8X10 ST (GAUZE/BANDAGES/DRESSINGS) ×4 IMPLANT
DURAPREP 26ML APPLICATOR (WOUND CARE) ×4 IMPLANT
ELECT REM PT RETURN 9FT ADLT (ELECTROSURGICAL) ×2
ELECTRODE REM PT RTRN 9FT ADLT (ELECTROSURGICAL) ×1 IMPLANT
FEMUR  CMT CCR STD SZ8 R KNEE (Knees) ×1 IMPLANT
FEMUR CMT CCR STD SZ8 R KNEE (Knees) ×1 IMPLANT
FEMUR CMTD CCR STD SZ8 R KNEE (Knees) ×1 IMPLANT
GAUZE SPONGE 4X4 12PLY STRL (GAUZE/BANDAGES/DRESSINGS) IMPLANT
GLOVE BIO SURGEON STRL SZ7 (GLOVE) ×2 IMPLANT
GLOVE BIOGEL M 7.0 STRL (GLOVE) ×4 IMPLANT
GLOVE BIOGEL PI IND STRL 7.0 (GLOVE) ×2 IMPLANT
GLOVE BIOGEL PI IND STRL 7.5 (GLOVE) ×1 IMPLANT
GLOVE BIOGEL PI INDICATOR 7.0 (GLOVE) ×2
GLOVE BIOGEL PI INDICATOR 7.5 (GLOVE) ×1
GLOVE SURG ORTHO 8.0 STRL STRW (GLOVE) ×4 IMPLANT
GOWN STRL REUS W/ TWL LRG LVL3 (GOWN DISPOSABLE) ×1 IMPLANT
GOWN STRL REUS W/ TWL XL LVL3 (GOWN DISPOSABLE) ×2 IMPLANT
GOWN STRL REUS W/TWL 2XL LVL3 (GOWN DISPOSABLE) ×2 IMPLANT
GOWN STRL REUS W/TWL LRG LVL3 (GOWN DISPOSABLE) ×1
GOWN STRL REUS W/TWL XL LVL3 (GOWN DISPOSABLE) ×2
HANDPIECE INTERPULSE COAX TIP (DISPOSABLE) ×1
HOOD PEEL AWAY FACE SHEILD DIS (HOOD) ×6 IMPLANT
KIT BASIN OR (CUSTOM PROCEDURE TRAY) ×2 IMPLANT
KIT ROOM TURNOVER OR (KITS) ×2 IMPLANT
KNEE CAPITATED TOTAL 3 ×1 IMPLANT
MANIFOLD NEPTUNE II (INSTRUMENTS) ×2 IMPLANT
NEEDLE 22X1 1/2 (OR ONLY) (NEEDLE) ×4 IMPLANT
NS IRRIG 1000ML POUR BTL (IV SOLUTION) ×2 IMPLANT
PACK TOTAL JOINT (CUSTOM PROCEDURE TRAY) ×2 IMPLANT
PACK UNIVERSAL I (CUSTOM PROCEDURE TRAY) ×2 IMPLANT
PAD ARMBOARD 7.5X6 YLW CONV (MISCELLANEOUS) ×4 IMPLANT
PADDING CAST COTTON 6X4 STRL (CAST SUPPLIES) IMPLANT
SET HNDPC FAN SPRY TIP SCT (DISPOSABLE) ×1 IMPLANT
STAPLER VISISTAT 35W (STAPLE) IMPLANT
STRIP CLOSURE SKIN 1/2X4 (GAUZE/BANDAGES/DRESSINGS) ×2 IMPLANT
SUCTION FRAZIER HANDLE 10FR (MISCELLANEOUS) ×1
SUCTION TUBE FRAZIER 10FR DISP (MISCELLANEOUS) ×1 IMPLANT
SUT BONE WAX W31G (SUTURE) ×2 IMPLANT
SUT MNCRL AB 3-0 PS2 18 (SUTURE) ×2 IMPLANT
SUT VIC AB 0 CTB1 27 (SUTURE) ×4 IMPLANT
SUT VIC AB 1 CT1 27 (SUTURE) ×3
SUT VIC AB 1 CT1 27XBRD ANBCTR (SUTURE) ×3 IMPLANT
SUT VIC AB 2-0 CT1 27 (SUTURE) ×2
SUT VIC AB 2-0 CT1 TAPERPNT 27 (SUTURE) ×2 IMPLANT
SYR 20CC LL (SYRINGE) ×4 IMPLANT
TOWEL OR 17X24 6PK STRL BLUE (TOWEL DISPOSABLE) ×2 IMPLANT
TOWEL OR 17X26 10 PK STRL BLUE (TOWEL DISPOSABLE) ×2 IMPLANT
TRAY CATH 16FR W/PLASTIC CATH (SET/KITS/TRAYS/PACK) ×2 IMPLANT
WATER STERILE IRR 1000ML POUR (IV SOLUTION) ×4 IMPLANT

## 2015-11-12 NOTE — Anesthesia Postprocedure Evaluation (Signed)
Anesthesia Post Note  Patient: Dwayne Delgado  Procedure(s) Performed: Procedure(s) (LRB): TOTAL KNEE ARTHROPLASTY (Right)  Patient location during evaluation: PACU Anesthesia Type: Spinal and MAC Level of consciousness: awake and alert Pain management: pain level controlled Vital Signs Assessment: post-procedure vital signs reviewed and stable Respiratory status: spontaneous breathing and respiratory function stable Cardiovascular status: blood pressure returned to baseline and stable Postop Assessment: spinal receding Anesthetic complications: no    Last Vitals:  Filed Vitals:   11/12/15 1128 11/12/15 1145  BP: 99/78 112/84  Pulse: 60 60  Temp:    Resp: 15 9    Last Pain:  Filed Vitals:   11/12/15 1147  PainSc: 3                  Tanice Petre DANIEL

## 2015-11-12 NOTE — Anesthesia Procedure Notes (Signed)
Spinal Patient location during procedure: OR Start time: 11/12/2015 8:28 AM End time: 11/12/2015 8:36 AM Staffing Anesthesiologist: Duane Boston Performed by: anesthesiologist  Preanesthetic Checklist Completed: patient identified, surgical consent, pre-op evaluation, timeout performed, IV checked, risks and benefits discussed and monitors and equipment checked Spinal Block Patient position: sitting Prep: DuraPrep Patient monitoring: cardiac monitor, continuous pulse ox and blood pressure Approach: midline Injection technique: single-shot Needle Needle type: Pencan  Needle gauge: 24 G Needle length: 9 cm Additional Notes Functioning IV was confirmed and monitors were applied. Sterile prep and drape, including hand hygiene and sterile gloves were used. The patient was positioned and the spine was prepped. The skin was anesthetized with lidocaine.  Free flow of clear CSF was obtained prior to injecting local anesthetic into the CSF.  The spinal needle aspirated freely following injection.  The needle was carefully withdrawn.  The patient tolerated the procedure well.

## 2015-11-12 NOTE — Anesthesia Preprocedure Evaluation (Signed)
Anesthesia Evaluation  Patient identified by MRN, date of birth, ID band Patient awake    Reviewed: Allergy & Precautions, NPO status , Patient's Chart, lab work & pertinent test results  History of Anesthesia Complications Negative for: history of anesthetic complications  Airway Mallampati: I  TM Distance: >3 FB Neck ROM: Full    Dental  (+) Poor Dentition, Dental Advisory Given, Missing   Pulmonary neg pulmonary ROS,    Pulmonary exam normal        Cardiovascular negative cardio ROS Normal cardiovascular exam     Neuro/Psych negative neurological ROS  negative psych ROS   GI/Hepatic negative GI ROS, Neg liver ROS,   Endo/Other  Morbid obesity  Renal/GU negative Renal ROS     Musculoskeletal   Abdominal   Peds  Hematology   Anesthesia Other Findings   Reproductive/Obstetrics                             Anesthesia Physical  Anesthesia Plan  ASA: II  Anesthesia Plan: Spinal   Post-op Pain Management:    Induction: Intravenous  Airway Management Planned: Natural Airway  Additional Equipment:   Intra-op Plan:   Post-operative Plan:   Informed Consent: I have reviewed the patients History and Physical, chart, labs and discussed the procedure including the risks, benefits and alternatives for the proposed anesthesia with the patient or authorized representative who has indicated his/her understanding and acceptance.   Dental advisory given  Plan Discussed with: CRNA and Surgeon  Anesthesia Plan Comments:         Anesthesia Quick Evaluation

## 2015-11-12 NOTE — H&P (Signed)
Dwayne Delgado MRN:  [id number] DOB/SEX:  [birth year]/male  CHIEF COMPLAINT:  Painful right Knee  HISTORY: Patient is a 50 y.o. male presented with a history of pain in the right knee. Onset of symptoms was gradual starting a few years ago with gradually worsening course since that time. Patient has been treated conservatively with over-the-counter NSAIDs and activity modification. Patient currently rates pain in the knee at 10 out of 10 with activity. There is pain at night.  PAST MEDICAL HISTORY: Patient Active Problem List   Diagnosis Date Noted  . 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 Nocturia 06/13/2014  . Healthcare maintenance 05/27/2011  . Obesity, Class I, BMI 30-34.9 04/09/2010   Past Medical History  Diagnosis Date  . Obesity   . Degenerative arthritis of knee, bilateral   . RLL pneumonia 06/13/2014   Past Surgical History  Procedure Laterality Date  . Knee arthroscopy Bilateral 1993, 01/2014  . Wisdom tooth extraction    . Total knee arthroplasty Left 08/27/2015    Procedure: TOTAL KNEE ARTHROPLASTY;  Surgeon: Vickey Huger, MD;  Location: [facility];  Service: Orthopedics;  Laterality: Left;     MEDICATIONS:   No prescriptions prior to admission    ALLERGIES:   Allergies  Allergen Reactions  . No Known Allergies     REVIEW OF SYSTEMS:  A comprehensive review of systems was negative except for: Musculoskeletal: positive for arthralgias, back pain and stiff joints   FAMILY HISTORY:   Family History  Problem Relation Age of Onset  . Coronary artery disease Father 39    MI  . Cancer Neg Hx   . Stroke Neg Hx   . Diabetes Neg Hx   . Hypertension Neg Hx     SOCIAL HISTORY:   Social History  Substance Use Topics  . Smoking status: Never Smoker   . Smokeless tobacco: Never Used  . Alcohol Use: 0.0 oz/week    0 Standard drinks or equivalent per week     Comment: Very rare     EXAMINATION:  Vital signs in last 24 hours:    There were no vitals taken for  this visit.  General Appearance:    Alert, cooperative, no distress, appears stated age  Head:    Normocephalic, without obvious abnormality, atraumatic  Eyes:    PERRL, conjunctiva/corneas clear, EOM's intact, fundi    benign, both eyes       Ears:    Normal TM's and external ear canals, both ears  Nose:   Nares normal, septum midline, mucosa normal, no drainage    or sinus tenderness  Throat:   Lips, mucosa, and tongue normal; teeth and gums normal  Neck:   Supple, symmetrical, trachea midline, no adenopathy;       thyroid:  No enlargement/tenderness/nodules; no carotid   bruit or JVD  Back:     Symmetric, no curvature, ROM normal, no CVA tenderness  Lungs:     Clear to auscultation bilaterally, respirations unlabored  Chest wall:    No tenderness or deformity  Heart:    Regular rate and rhythm, S1 and S2 normal, no murmur, rub   or gallop  Abdomen:     Soft, non-tender, bowel sounds active all four quadrants,    no masses, no organomegaly  Genitalia:    Normal male without lesion, discharge or tenderness  Rectal:    Normal tone, normal prostate, no masses or tenderness;   guaiac negative stool  Extremities:   Extremities normal, atraumatic, no  cyanosis or edema  Pulses:   2+ and symmetric all extremities  Skin:   Skin color, texture, turgor normal, no rashes or lesions  Lymph nodes:   Cervical, supraclavicular, and axillary nodes normal  Neurologic:   CNII-XII intact. Normal strength, sensation and reflexes      throughout    Musculoskeletal:  ROM 0-120, Ligaments intact,  Imaging Review Plain radiographs demonstrate severe degenerative joint disease of the right knee. The overall alignment is neutral. The bone quality appears to be excellent for age and reported activity level.  Assessment/Plan: Primary osteoarthritis, right knee   The patient history, physical examination and imaging studies are consistent with advanced degenerative joint disease of the right knee. The  patient has failed conservative treatment.  The clearance notes were reviewed.  After discussion with the patient it was felt that Total Knee Replacement was indicated. The procedure,  risks, and benefits of total knee arthroplasty were presented and reviewed. The risks including but not limited to aseptic loosening, infection, blood clots, vascular injury, stiffness, patella tracking problems complications among others were discussed. The patient acknowledged the explanation, agreed to proceed with the plan.  Donia Ast 11/12/2015, 6:31 AM

## 2015-11-12 NOTE — Transfer of Care (Signed)
Immediate Anesthesia Transfer of Care Note  Patient: Dwayne Delgado  Procedure(s) Performed: Procedure(s): TOTAL KNEE ARTHROPLASTY (Right)  Patient Location: PACU  Anesthesia Type:Spinal  Level of Consciousness: awake, alert  and oriented  Airway & Oxygen Therapy: Patient Spontanous Breathing  Post-op Assessment: Report given to RN and Post -op Vital signs reviewed and stable  Post vital signs: Reviewed and stable  Last Vitals:  Filed Vitals:   11/12/15 0716  BP: 135/91  Pulse: 83  Temp: 36.6 C  Resp: 20    Last Pain: There were no vitals filed for this visit.       Complications: No apparent anesthesia complications

## 2015-11-12 NOTE — Progress Notes (Signed)
Orthopedic Tech Progress Note Patient Details:  Dwayne Delgado 1965-07-10 [id number]  CPM Right Knee CPM Right Knee: On Right Knee Flexion (Degrees): 0 Right Knee Extension (Degrees): 0 Additional Comments: trapeze bar patient helper Viewed order from doctor's order list  Sharlet Salina, Chiron Campione 11/12/2015, 11:00 AM

## 2015-11-12 NOTE — Evaluation (Signed)
Physical Therapy Evaluation Patient Details Name: Dwayne Delgado MRN: [id number] DOB: 03-Jun-1965 Today's Date: 11/12/2015   History of Present Illness  Pt is a 50 y/o male who presents s/p elective R TKA on 11/12/15. PMH includes L TKA in May 2017.   Clinical Impression  This patient presents with acute pain and decreased functional independence following the above mentioned procedure. At the time of PT eval, pt was able to perform transfers and ambulation with min guard to supervision for safety. Anticipate pt will progress well with PT and may be appropriate to begin outpatient PT at d/c. This patient is appropriate for skilled PT interventions to address functional limitations, improve safety and independence with functional mobility, and return to PLOF.     Follow Up Recommendations Home health PT (vs.outpatient depending on progress)    Equipment Recommendations  None recommended by PT    Recommendations for Other Services       Precautions / Restrictions Precautions Precautions: Fall;Knee Precaution Booklet Issued: Yes (comment) Precaution Comments: Pt was instructed in use of bone foam and NO pillow/ice pack/roll under knee.  Restrictions Weight Bearing Restrictions: Yes RLE Weight Bearing: Weight bearing as tolerated      Mobility  Bed Mobility Overal bed mobility: Needs Assistance Bed Mobility: Supine to Sit     Supine to sit: Supervision     General bed mobility comments: Pt was able to transition to EOB with gross supervision for safety. Increased time required; HOB elevated.   Transfers Overall transfer level: Needs assistance Equipment used: Rolling walker (2 wheeled) Transfers: Sit to/from Stand Sit to Stand: Min guard         General transfer comment: Pt somewhat impulsive to initiate transfer to standing and was cued to wait for therapist and RW to be in front of him before attempting to stand. Pt was able to achieve standing without physical  assistance, however close guard was provided for safety.   Ambulation/Gait Ambulation/Gait assistance: Min guard Ambulation Distance (Feet): 100 Feet Assistive device: Rolling walker (2 wheeled) Gait Pattern/deviations: Step-through pattern;Decreased stride length;Trunk flexed Gait velocity: Decreased Gait velocity interpretation: Below normal speed for age/gender General Gait Details: Pt was able to ambulate well with RW in the hall. VC's for improved posture and general safety with the RW.   Stairs            Wheelchair Mobility    Modified Rankin (Stroke Patients Only)       Balance Overall balance assessment: Needs assistance Sitting-balance support: Feet supported;No upper extremity supported Sitting balance-Leahy Scale: Good     Standing balance support: No upper extremity supported;During functional activity Standing balance-Leahy Scale: Fair Standing balance comment: Static standing activity.                              Pertinent Vitals/Pain Pain Assessment: 0-10 Pain Score: 7  Pain Location: Knee Pain Descriptors / Indicators: Operative site guarding;Sore Pain Intervention(s): Limited activity within patient's tolerance;Monitored during session;Repositioned    Home Living Family/patient expects to be discharged to:: Private residence Living Arrangements: Spouse/significant other Available Help at Discharge: Family;Available PRN/intermittently;Friend(s) (24 hours between wife and friend`) Type of Home: House Home Access: Stairs to enter Entrance Stairs-Rails: None Entrance Stairs-Number of Steps: 1 Home Layout: Two level Home Equipment: [place] - 2 wheels;Cane - single point;Shower seat      Prior Function Level of Independence: Independent  Hand Dominance   Dominant Hand: Right    Extremity/Trunk Assessment   Upper Extremity Assessment: Overall WFL for tasks assessed           Lower Extremity Assessment:  RLE deficits/detail RLE Deficits / Details: Decreased strength and AROM consistent with above mentioned procedure.     Cervical / Trunk Assessment: Normal  Communication   Communication: No difficulties  Cognition Arousal/Alertness: Awake/alert Behavior During Therapy: WFL for tasks assessed/performed Overall Cognitive Status: Within Functional Limits for tasks assessed                      General Comments General comments (skin integrity, edema, etc.): Issued HEP and reviewed with pt. He was already familiar with total knee packet from prior surgery in May.     Exercises Total Joint Exercises Ankle Circles/Pumps: 10 reps Quad Sets: 15 reps Hip ABduction/ADduction: 10 reps      Assessment/Plan    PT Assessment Patient needs continued PT services  PT Diagnosis Difficulty walking;Acute pain   PT Problem List Decreased strength;Decreased range of motion;Decreased activity tolerance;Decreased balance;Decreased mobility;Decreased knowledge of use of DME;Decreased safety awareness;Decreased knowledge of precautions;Pain  PT Treatment Interventions DME instruction;Gait training;Functional mobility training;Stair training;Therapeutic activities;Therapeutic exercise;Neuromuscular re-education;Patient/family education   PT Goals (Current goals can be found in the Care Plan section) Acute Rehab PT Goals Patient Stated Goal: Home tomorrow PT Goal Formulation: With patient/family Time For Goal Achievement: 11/19/15 Potential to Achieve Goals: Good    Frequency 7X/week   Barriers to discharge        Co-evaluation               End of Session Equipment Utilized During Treatment: Gait belt Activity Tolerance: Patient tolerated treatment well Patient left: in chair;with call bell/phone within reach;with family/visitor present Nurse Communication: Mobility status         Time: 1400-1430 PT Time Calculation (min) (ACUTE ONLY): 30 min   Charges:   PT  Evaluation $PT Eval Moderate Complexity: 1 Procedure PT Treatments $Gait Training: 8-22 mins   PT G Codes:        Rolinda Roan 2015/12/12, 2:47 PM   Rolinda Roan, PT, DPT Acute Rehabilitation Services Pager: [phone]

## 2015-11-13 ENCOUNTER — Encounter (HOSPITAL_COMMUNITY): Payer: Self-pay | Admitting: Orthopedic Surgery

## 2015-11-13 LAB — CBC
HCT: 39.3 % (ref 39.0–52.0)
Hemoglobin: 12.5 g/dL — ABNORMAL LOW (ref 13.0–17.0)
MCH: 25.4 pg — AB (ref 26.0–34.0)
MCHC: 31.8 g/dL (ref 30.0–36.0)
MCV: 79.9 fL (ref 78.0–100.0)
PLATELETS: 277 10*3/uL (ref 150–400)
RBC: 4.92 MIL/uL (ref 4.22–5.81)
RDW: 13.5 % (ref 11.5–15.5)
WBC: 11.8 10*3/uL — ABNORMAL HIGH (ref 4.0–10.5)

## 2015-11-13 LAB — BASIC METABOLIC PANEL
Anion gap: 6 (ref 5–15)
BUN: 10 mg/dL (ref 6–20)
CO2: 24 mmol/L (ref 22–32)
Calcium: 8.9 mg/dL (ref 8.9–10.3)
Chloride: 107 mmol/L (ref 101–111)
Creatinine, Ser: 0.88 mg/dL (ref 0.61–1.24)
GFR calc Af Amer: >60 mL/min (ref >60)
GLUCOSE: 111 mg/dL — AB (ref 65–99)
POTASSIUM: 3.7 mmol/L (ref 3.5–5.1)
Sodium: 137 mmol/L (ref 135–145)

## 2015-11-13 MED ORDER — ASPIRIN 325 MG PO TBEC: 325.0000 mg | DELAYED_RELEASE_TABLET | Freq: Two times a day (BID) | ORAL | Status: DC

## 2015-11-13 MED ORDER — OXYCODONE HCL 5 MG PO TABS: 5.0000 mg | ORAL_TABLET | ORAL | Status: DC | PRN

## 2015-11-13 NOTE — Progress Notes (Signed)
   11/13/15 1430  Clinical Encounter Type  Visited With Patient  Visit Type Initial  On afternoon rounds Chaplain visited with patient.  Patient mentioned his minister had stopped by earlier.  Patient also shared he was being discharged.  Chaplain wished patient a blessed recovery and return to good health.

## 2015-11-13 NOTE — Progress Notes (Signed)
SPORTS MEDICINE AND JOINT REPLACEMENT  Lara Mulch, MD    Carlyon Shadow, PA-C [address], [place], [place]  [address]                             [phone]   PROGRESS NOTE  Subjective:  negative for Chest Pain  negative for Shortness of Breath  negative for Nausea/Vomiting   negative for Calf Pain  negative for Bowel Movement   Tolerating Diet: yes         Patient reports pain as 6 on 0-10 scale.    Objective: Vital signs in last 24 hours:   Patient Vitals for the past 24 hrs:  BP Temp Temp src Pulse Resp SpO2  11/13/15 0458 115/69 mmHg 98.2 F (36.8 C) Oral 95 17 98 %  11/13/15 0100 (!) 139/98 mmHg 98.9 F (37.2 C) - 96 18 99 %  11/12/15 2011 140/71 mmHg 98.3 F (36.8 C) - 87 17 99 %  11/12/15 1506 134/84 mmHg 97.4 F (36.3 C) Oral 90 18 93 %  11/12/15 1434 (!) 144/82 mmHg 97.8 F (36.6 C) Oral 67 17 99 %  11/12/15 1215 122/79 mmHg - - 60 13 99 %  11/12/15 1200 131/87 mmHg - - 63 12 100 %  11/12/15 1145 112/84 mmHg 97.9 F (36.6 C) - 60 (!) 9 99 %  11/12/15 1128 99/78 mmHg - - 60 15 100 %  11/12/15 1115 107/64 mmHg - - 65 11 100 %  11/12/15 1100 103/75 mmHg - - 66 14 100 %  11/12/15 1045 99/70 mmHg 97.2 F (36.2 C) - 76 14 98 %    @flow {1959:LAST@   Intake/Output from previous day:   07/17 0701 - 07/18 0700 In: 1943.8 [P.O.:480; I.V.:1413.8] Out: 2180 [Urine:2150]   Intake/Output this shift:       Intake/Output      07/17 0701 - 07/18 0700 07/18 0701 - 07/19 0700   P.O. 480    I.V. (mL/kg) 1413.8 (13.2)    IV Piggyback 50    Total Intake(mL/kg) 1943.8 (18.2)    Urine (mL/kg/hr) 2150 (0.8)    Blood 30 (0)    Total Output 2180     Net -236.3          Urine Occurrence 4 x       LABORATORY DATA: No results for input(s): WBC, HGB, HCT, PLT in the last 168 hours. No results for input(s): NA, K, CL, CO2, BUN, CREATININE, GLUCOSE, CALCIUM in the last 168 hours. Lab Results  Component Value Date   INR 0.99 11/01/2015   INR 1.09 08/16/2015     Examination:  General appearance: alert, cooperative and no distress Extremities: extremities normal, atraumatic, no cyanosis or edema  Wound Exam: clean, dry, intact   Drainage:  None: wound tissue dry  Motor Exam: Quadriceps and Hamstrings Intact  Sensory Exam: Superficial Peroneal, Deep Peroneal and Tibial normal   Assessment:    1 Day Post-Op  Procedure(s) (LRB): TOTAL KNEE ARTHROPLASTY (Right)  ADDITIONAL DIAGNOSIS:  Active Problems:   S/P total knee replacement  Acute Blood Loss Anemia   Plan: Physical Therapy as ordered Weight Bearing as Tolerated (WBAT)  DVT Prophylaxis:  Aspirin  DISCHARGE PLAN: Home  DISCHARGE NEEDS: HHPT   Patient looks great. Will D/C today         Donia Ast 11/13/2015, 7:16 AM

## 2015-11-13 NOTE — Progress Notes (Signed)
Received report from day shift that the 3 pack ice pack was never given or placed on the patient. On night shift, attempts were made to locate a 3 pack ice pack from PACU with no success. PA on-call was notified. A regular ice pack was placed on the patient's knee.

## 2015-11-13 NOTE — Progress Notes (Signed)
Patient discharged to home, discharge instructions given, patient stated he understood, prescriptions given

## 2015-11-13 NOTE — Progress Notes (Signed)
Occupational Therapy Evaluation Patient Details Name: Dwayne Delgado MRN: [id number] DOB: Jun 06, 1965 Today's Date: 11/13/2015    History of Present Illness Pt is a 50 y/o male who presents s/p elective R TKA on 11/12/15. PMH includes L TKA in May 2017.    Clinical Impression   All OT education completed and pt questions answered. No further OT needs at this time. Will sign off.    Follow Up Recommendations  No OT follow up;Supervision/Assistance - 24 hour    Equipment Recommendations  None recommended by OT    Recommendations for Other Services       Precautions / Restrictions Precautions Precautions: Fall;Knee Restrictions Weight Bearing Restrictions: Yes RLE Weight Bearing: Weight bearing as tolerated      Mobility Bed Mobility                  Transfers                      Balance                                            ADL Overall ADL's : Needs assistance/impaired Eating/Feeding: Independent   Grooming: Set up   Upper Body Bathing: Set up;Sitting   Lower Body Bathing: Minimal assistance;Sit to/from stand   Upper Body Dressing : Set up;Sitting   Lower Body Dressing: Minimal assistance;Sit to/from Health and safety inspector Details (indicate cue type and reason): pt reports he has been up to bathroom for toileting multiple times with nursing staff without difficulty using RW   Toileting - Clothing Manipulation Details (indicate cue type and reason): pt reports no difficulty       General ADL Comments: Patient received in bed. Had recent L TKA and remembers ADL techniques from that surgery. He needs min A for LB self-care. Educated him on availability of AE for LB self-care and that he can probably obtain through [place]. Patient reports good bathroom set up at home and denies need to practice toilet and shower transfers this date. He reports he has been up to bathroom with nursing multiple times and also walked with RW  around the nursing unit last night. No further OT needs identified.     Vision     Perception     Praxis      Pertinent Vitals/Pain Pain Assessment: 0-10 Pain Score: 6  Pain Location: R knee Pain Descriptors / Indicators: Sore Pain Intervention(s): Limited activity within patient's tolerance;Monitored during session     Hand Dominance Right   Extremity/Trunk Assessment Upper Extremity Assessment Upper Extremity Assessment: Overall WFL for tasks assessed   Lower Extremity Assessment Lower Extremity Assessment: Defer to PT evaluation   Cervical / Trunk Assessment Cervical / Trunk Assessment: Normal   Communication Communication Communication: No difficulties   Cognition Arousal/Alertness: Awake/alert Behavior During Therapy: WFL for tasks assessed/performed Overall Cognitive Status: Within Functional Limits for tasks assessed                     General Comments       Exercises       Shoulder Instructions      Home Living Family/patient expects to be discharged to:: Private residence Living Arrangements: Spouse/significant other Available Help at Discharge: Family;Friend(s);Available 24 hours/day (24 hour care between friend and wife) Type of Home: House Home Access: Stairs  to enter Entrance Stairs-Number of Steps: 1 Entrance Stairs-Rails: None Home Layout: Two level Alternate Level Stairs-Number of Steps: flight Alternate Level Stairs-Rails: Right Bathroom Shower/Tub: Occupational psychologist: Standard Bathroom Accessibility: Yes How Accessible: Accessible via walker Home Equipment: [place] - 2 wheels;Cane - single point;Shower seat;Bedside commode          Prior Functioning/Environment Level of Independence: Independent        Comments: Served in [employer] for 10 years    OT Diagnosis: Acute pain   OT Problem List: Decreased strength;Decreased range of motion;Pain   OT Treatment/Interventions:      OT Goals(Current goals  can be found in the care plan section) Acute Rehab OT Goals Patient Stated Goal: home today OT Goal Formulation: All assessment and education complete, DC therapy  OT Frequency:     Barriers to D/C:            Co-evaluation              End of Session    Activity Tolerance: Patient tolerated treatment well Patient left: in bed;with call bell/phone within reach   Time: 0809-0819 OT Time Calculation (min): 10 min Charges:  OT General Charges $OT Visit: 1 Procedure OT Evaluation $OT Eval Low Complexity: 1 Procedure G-Codes:    Rodert Hinch A 2015/11/30, 8:27 AM

## 2015-11-13 NOTE — Discharge Summary (Signed)
SPORTS MEDICINE & JOINT REPLACEMENT   Lara Mulch, MD   Carlyon Shadow, PA-C [place], [place], [place]  [address]                             [phone]  PATIENT ID: Dwayne Delgado        MRN:  [id number]          DOB/AGE: 09/19/1965 / 50 y.o.    DISCHARGE SUMMARY  ADMISSION DATE:    11/12/2015 DISCHARGE DATE:   11/13/2015   ADMISSION DIAGNOSIS: primary osteoarthritis right knee    DISCHARGE DIAGNOSIS:  primary osteoarthritis right knee    ADDITIONAL DIAGNOSIS: Active Problems:   S/P total knee replacement  Past Medical History  Diagnosis Date  . Obesity   . Degenerative arthritis of knee, bilateral   . RLL pneumonia 06/13/2014    PROCEDURE: Procedure(s): TOTAL KNEE ARTHROPLASTY on 11/12/2015  CONSULTS:     HISTORY:  See H&P in chart  HOSPITAL COURSE:  Dwayne Delgado is a 50 y.o. admitted on 11/12/2015 and found to have a diagnosis of primary osteoarthritis right knee.  After appropriate laboratory studies were obtained  they were taken to the operating room on 11/12/2015 and underwent Procedure(s): TOTAL KNEE ARTHROPLASTY.   They were given perioperative antibiotics:  Anti-infectives    Start     Dose/Rate Route Frequency Ordered Stop   11/12/15 1400  ceFAZolin (ANCEF) IVPB 1 g/50 mL premix     1 g 100 mL/hr over 30 Minutes Intravenous Every 6 hours 11/12/15 1234 11/12/15 2249   11/12/15 1245  hydroxychloroquine (PLAQUENIL) tablet 200 mg     200 mg Oral 2 times daily 11/12/15 1234     11/12/15 0800  ceFAZolin (ANCEF) IVPB 2g/100 mL premix     2 g 200 mL/hr over 30 Minutes Intravenous To [facility] 11/09/15 1409 11/12/15 0838    .  Patient given tranexamic acid IV or topical and exparel intra-operatively.  Tolerated the procedure well.    POD# 1: Vital signs were stable.  Patient denied Chest pain, shortness of breath, or calf pain.  Patient was started on Lovenox 30 mg subcutaneously twice daily at 8am.  Consults to PT, OT, and care  management were made.  The patient was weight bearing as tolerated.  CPM was placed on the operative leg 0-90 degrees for 6-8 hours a day. When out of the CPM, patient was placed in the foam block to achieve full extension. Incentive spirometry was taught.  Dressing was changed.       POD #2, Continued  PT for ambulation and exercise program.  IV saline locked.  O2 discontinued.    The remainder of the hospital course was dedicated to ambulation and strengthening.   The patient was discharged on 1 Day Post-Op in  Good condition.  Blood products given:none  DIAGNOSTIC STUDIES: Recent vital signs: Patient Vitals for the past 24 hrs:  BP Temp Temp src Pulse Resp SpO2  11/13/15 0458 115/69 mmHg 98.2 F (36.8 C) Oral 95 17 98 %  11/13/15 0100 (!) 139/98 mmHg 98.9 F (37.2 C) - 96 18 99 %  11/12/15 2011 140/71 mmHg 98.3 F (36.8 C) - 87 17 99 %  11/12/15 1506 134/84 mmHg 97.4 F (36.3 C) Oral 90 18 93 %  11/12/15 1434 (!) 144/82 mmHg 97.8 F (36.6 C) Oral 67 17 99 %  11/12/15 1215 122/79 mmHg - - 60 13 99 %  11/12/15 1200 131/87 mmHg - - 63 12 100 %  11/12/15 1145 112/84 mmHg 97.9 F (36.6 C) - 60 (!) 9 99 %  11/12/15 1128 99/78 mmHg - - 60 15 100 %  11/12/15 1115 107/64 mmHg - - 65 11 100 %  11/12/15 1100 103/75 mmHg - - 66 14 100 %  11/12/15 1045 99/70 mmHg 97.2 F (36.2 C) - 76 14 98 %       Recent laboratory studies: No results for input(s): WBC, HGB, HCT, PLT in the last 168 hours. No results for input(s): NA, K, CL, CO2, BUN, CREATININE, GLUCOSE, CALCIUM in the last 168 hours. Lab Results  Component Value Date   INR 0.99 11/01/2015   INR 1.09 08/16/2015     Recent Radiographic Studies :  Dg Chest 2 View  11/01/2015  CLINICAL DATA:  Right knee replacement. EXAM: CHEST  2 VIEW COMPARISON:  06/13/2014. FINDINGS: Mediastinum and hilar structures are normal. Low lung volumes. Previous identified left base subsegmental atelectasis and or infiltrate has cleared. No pleural  effusion or pneumothorax. Degenerative changes thoracic spine . IMPRESSION: No acute cardiopulmonary disease. Electronically Signed   By: Marcello Moores  Register   On: 11/01/2015 09:16    DISCHARGE INSTRUCTIONS:   DISCHARGE MEDICATIONS:     Medication List    ASK your doctor about these medications        hydroxychloroquine 200 MG tablet  Commonly known as:  PLAQUENIL  Take 200 mg by mouth 2 (two) times daily.     leflunomide 20 MG tablet  Commonly known as:  ARAVA  Take 20 mg by mouth daily.     methocarbamol 500 MG tablet  Commonly known as:  ROBAXIN  Take 1-2 tablets (500-1,000 mg total) by mouth every 6 (six) hours as needed for muscle spasms.     MULTIVITAMIN PO  Take 1 tablet by mouth daily.     oxyCODONE 5 MG immediate release tablet  Commonly known as:  Oxy IR/ROXICODONE  Take 1-2 tablets (5-10 mg total) by mouth every 3 (three) hours as needed for breakthrough pain.     terbinafine 1 % cream  Commonly known as:  LAMISIL  Apply 1 application topically daily.        FOLLOW UP VISIT:    DISPOSITION: HOME VS. SNF  CONDITION:  Good   Donia Ast 11/13/2015, 7:17 AM

## 2015-11-13 NOTE — Progress Notes (Signed)
Physical Therapy Treatment Patient Details Name: Dwayne Delgado MRN: [id number] DOB: 08/19/1965 Today's Date: 11/13/2015    History of Present Illness Pt is a 50 y/o male who presents s/p elective R TKA on 11/12/15. PMH includes L TKA in May 2017.     PT Comments    Pt presented supine in bed with HOB elevated and R LE elevated when PT entered room. He progressed in distance ambulated since previous session; however, pt required VC'ing during ambulation to for safety with RW. He completed stair training during today's session as well. Pt would continue to benefit from skilled physical therapy services at this time while admitted and after d/c. PT recommending HH PT after d/c.   Follow Up Recommendations  Home health PT     Equipment Recommendations  None recommended by PT    Recommendations for Other Services       Precautions / Restrictions Precautions Precautions: Fall;Knee Precaution Booklet Issued: Yes (comment) Restrictions Weight Bearing Restrictions: Yes RLE Weight Bearing: Weight bearing as tolerated    Mobility  Bed Mobility Overal bed mobility: Needs Assistance Bed Mobility: Supine to Sit     Supine to sit: Supervision     General bed mobility comments: Pt was able to transition to EOB with gross supervision for safety. Increased time required; HOB elevated.   Transfers Overall transfer level: Needs assistance Equipment used: Rolling walker (2 wheeled) Transfers: Sit to/from Stand Sit to Stand: Min guard         General transfer comment: PT provided min guard for safety with transfer and verbal cueing needed for bilateral hand positioning to assist with transfer.  Ambulation/Gait Ambulation/Gait assistance: Min guard Ambulation Distance (Feet): 200 Feet Assistive device: Rolling walker (2 wheeled) Gait Pattern/deviations: Step-through pattern;Decreased step length - left;Decreased stance time - right;Decreased weight shift to right Gait velocity:  Decreased Gait velocity interpretation: Below normal speed for age/gender     Stairs Stairs: Yes Stairs assistance: Min guard Stair Management: One rail Left Number of Stairs: 2 General stair comments: PT provided VC's to ascend with L LE leading and descend with R LE leading  Wheelchair Mobility    Modified Rankin (Stroke Patients Only)       Balance Overall balance assessment: Needs assistance Sitting-balance support: Feet supported;No upper extremity supported Sitting balance-Leahy Scale: Good     Standing balance support: No upper extremity supported Standing balance-Leahy Scale: Fair                      Cognition Arousal/Alertness: Awake/alert Behavior During Therapy: WFL for tasks assessed/performed Overall Cognitive Status: Within Functional Limits for tasks assessed                      Exercises Total Joint Exercises Quad Sets: AROM;Strengthening;Right;10 reps Heel Slides: AROM;Strengthening;Right;10 reps Long Arc Quad: AROM;Strengthening;Right;10 reps Knee Flexion: AROM;Strengthening;Right;10 reps Goniometric ROM: Extension = lacking 15 degrees to neutral; Flexion = 75 degrees (seated)    General Comments        Pertinent Vitals/Pain Pain Assessment: 0-10 Pain Score: 4  Pain Location: R knee Pain Descriptors / Indicators: Discomfort;Operative site guarding Pain Intervention(s): Limited activity within patient's tolerance;Monitored during session;Repositioned    Home Living Family/patient expects to be discharged to:: Private residence Living Arrangements: Spouse/significant other Available Help at Discharge: Family;Friend(s);Available 24 hours/day (24 hour care between friend and wife) Type of Home: House Home Access: Stairs to enter Entrance Stairs-Rails: None Home Layout: Two level Home Equipment: Environmental consultant -  2 wheels;Cane - single point;Shower seat;Bedside commode      Prior Function Level of Independence: Independent       Comments: Served in [employer] for 10 years   PT Goals (current goals can now be found in the care plan section) Acute Rehab PT Goals Patient Stated Goal: Pt would like to return home today  PT Goal Formulation: With patient/family Time For Goal Achievement: 11/19/15 Potential to Achieve Goals: Good Progress towards PT goals: Progressing toward goals    Frequency  7X/week    PT Plan Current plan remains appropriate    Co-evaluation             End of Session Equipment Utilized During Treatment: Gait belt Activity Tolerance: Patient tolerated treatment well Patient left: in chair;with call bell/phone within reach     Time: 0905-0924 PT Time Calculation (min) (ACUTE ONLY): 19 min  Charges:  $Gait Training: 8-22 mins                    G CodesClearnce Sorrel Neal Oshea 11-22-15, 10:15 AM Sherie Don, [place], DPT [phone]

## 2015-11-13 NOTE — Progress Notes (Signed)
Physical Therapy Treatment Patient Details Name: Dwayne Delgado MRN: [id number] DOB: 1965-05-02 Today's Date: 11/13/2015    History of Present Illness Pt is a 50 y/o male who presents s/p elective R TKA on 11/12/15. PMH includes L TKA in May 2017.     PT Comments    Pt presented sitting upright EOB when PT entered room and willing to participate in therapy session. Pt stated that he was still planning to d/c from hospital later today. He also reported that his [facility] PT was planning to see him tomorrow. Pt would continue to benefit from skilled physical therapy services at this time while admitted and after d/c to address his limitations in order to improve his safety and independence with functional mobility.    Follow Up Recommendations  Home health PT     Equipment Recommendations  None recommended by PT    Recommendations for Other Services       Precautions / Restrictions Precautions Precautions: Fall;Knee Restrictions Weight Bearing Restrictions: Yes RLE Weight Bearing: Weight bearing as tolerated    Mobility  Bed Mobility               General bed mobility comments: Pt sitting upright at EOB independently when PT entered room and returned to sitting in recliner at the end of the session.  Transfers Overall transfer level: Needs assistance Equipment used: Rolling walker (2 wheeled) Transfers: Sit to/from Stand Sit to Stand: Min guard         General transfer comment: PT provided min guard for safety with transfer and verbal cueing needed for bilateral hand positioning to assist with transfer.  Ambulation/Gait Ambulation/Gait assistance: Min guard Ambulation Distance (Feet): 200 Feet Assistive device: Rolling walker (2 wheeled) Gait Pattern/deviations: Step-through pattern;Decreased step length - left;Decreased stance time - right;Decreased weight shift to right Gait velocity: Decreased Gait velocity interpretation: Below normal speed for age/gender      Stairs            Wheelchair Mobility    Modified Rankin (Stroke Patients Only)       Balance Overall balance assessment: Needs assistance Sitting-balance support: No upper extremity supported;Feet supported Sitting balance-Leahy Scale: Good     Standing balance support: No upper extremity supported Standing balance-Leahy Scale: Fair                      Cognition Arousal/Alertness: Awake/alert Behavior During Therapy: WFL for tasks assessed/performed Overall Cognitive Status: Within Functional Limits for tasks assessed                      Exercises Total Joint Exercises Quad Sets: AROM;Strengthening;Right;10 reps Long Arc Quad: AROM;Strengthening;Right;10 reps Knee Flexion: AROM;Strengthening;Right;10 reps    General Comments        Pertinent Vitals/Pain Pain Assessment: 0-10 Pain Score: 4  Pain Location: R knee Pain Descriptors / Indicators: Discomfort;Operative site guarding Pain Intervention(s): Limited activity within patient's tolerance;Monitored during session;Repositioned    Home Living                      Prior Function            PT Goals (current goals can now be found in the care plan section) Acute Rehab PT Goals Patient Stated Goal: Pt would like to return home today  PT Goal Formulation: With patient Time For Goal Achievement: 11/19/15 Potential to Achieve Goals: Good Progress towards PT goals: Progressing toward goals    Frequency  7X/week    PT Plan Current plan remains appropriate    Co-evaluation             End of Session Equipment Utilized During Treatment: Gait belt Activity Tolerance: Patient tolerated treatment well Patient left: in chair;with call bell/phone within reach     Time: 1339-1355 PT Time Calculation (min) (ACUTE ONLY): 16 min  Charges:  $Gait Training: 8-22 mins                    G CodesClearnce Sorrel Mehar Sagen December 07, 2015, 3:16 PM Sherie Don, [place],  DPT [phone]

## 2015-11-13 NOTE — Op Note (Addendum)
TOTAL KNEE REPLACEMENT OPERATIVE NOTE:  11/12/2015  8:02 AM  PATIENT:  Dwayne Delgado  50 y.o. male  PRE-OPERATIVE DIAGNOSIS:  primary osteoarthritis right knee  POST-OPERATIVE DIAGNOSIS:  primary osteoarthritis right knee  PROCEDURE:  Procedure(s): TOTAL KNEE ARTHROPLASTY  SURGEON:  Surgeon(s): Vickey Huger, MD  PHYSICIAN ASSISTANT:  Nehemiah Massed, PA-C   ANESTHESIA:   spinal  DRAINS: Hemovac  SPECIMEN: None  COUNTS:  Correct  TOURNIQUET:   Total Tourniquet Time Documented: Thigh (Right) - 48 minutes Total: Thigh (Right) - 48 minutes   DICTATION:  Indication for procedure:    The patient is a 50 y.o. male who has failed conservative treatment for primary osteoarthritis right knee.  Informed consent was obtained prior to anesthesia. The risks versus benefits of the operation were explain and in a way the patient can, and did, understand.   On the implant demand matching protocol, this patient scored 10.  Therefore, this patient did" "did not receive a polyethylene insert with vitamin E which is a high demand implant.  Description of procedure:     The patient was taken to the operating room and placed under anesthesia.  The patient was positioned in the usual fashion taking care that all body parts were adequately padded and/or protected.  I foley catheter was not placed.  A tourniquet was applied and the leg prepped and draped in the usual sterile fashion.  The extremity was exsanguinated with the esmarch and tourniquet inflated to 350 mmHg.  Pre-operative range of motion was 0-90 degrees flexion.  The knee was in 3 degree of mild varus.  A midline incision approximately 6-7 inches long was made with a #10 blade.  A new blade was used to make a parapatellar arthrotomy going 2-3 cm into the quadriceps tendon, over the patella, and alongside the medial aspect of the patellar tendon.  A synovectomy was then performed with the #10 blade and forceps. I then elevated the deep  MCL off the medial tibial metaphysis subperiosteally around to the semimembranosus attachment.    I everted the patella and used calipers to measure patellar thickness.  I used the reamer to ream down to appropriate thickness to recreate the native thickness.  I then removed excess bone with the rongeur and sagittal saw.  I used the appropriately sized template and drilled the three lug holes.  I then put the trial in place and measured the thickness with the calipers to ensure recreation of the native thickness.  The trial was then removed and the patella subluxed and the knee brought into flexion.  A homan retractor was place to retract and protect the patella and lateral structures.  A Z-retractor was place medially to protect the medial structures.  The extra-medullary alignment system was used to make cut the tibial articular surface perpendicular to the anamotic axis of the tibia and in 3 degrees of posterior slope.  The cut surface and alignment jig was removed.  I then used the intramedullary alignment guide to make a 6 valgus cut on the distal femur.  I then marked out the epicondylar axis on the distal femur.  The posterior condylar axis measured 3 degrees.  I then used the anterior referencing sizer and measured the femur to be a size 8.  The 4-In-1 cutting block was screwed into place in external rotation matching the posterior condylar angle, making our cuts perpendicular to the epicondylar axis.  Anterior, posterior and chamfer cuts were made with the sagittal saw.  The cutting block  and cut pieces were removed.  A lamina spreader was placed in 90 degrees of flexion.  The ACL, PCL, menisci, and posterior condylar osteophytes were removed.  A 10 mm spacer blocked was found to offer good flexion and extension gap balance after mild in degree releasing.   The scoop retractor was then placed and the femoral finishing block was pinned in place.  The small sagittal saw was used as well as the lug  drill to finish the femur.  The block and cut surfaces were removed and the medullary canal hole filled with autograft bone from the cut pieces.  The tibia was delivered forward in deep flexion and external rotation.  A size F tray was selected and pinned into place centered on the medial 1/3 of the tibial tubercle.  The reamer and keel was used to prepare the tibia through the tray.    I then trialed with the size 8 femur, size F tibia, a 10 mm insert and the 35 patella.  I had excellent flexion/extension gap balance, excellent patella tracking.  Flexion was full and beyond 120 degrees; extension was zero.  These components were chosen and the staff opened them to me on the back table while the knee was lavaged copiously and the cement mixed.  The soft tissue was infiltrated with 60cc of exparel 1.3% through a 21 gauge needle.  I cemented in the components and removed all excess cement.  The polyethylene tibial component was snapped into place and the knee placed in extension while cement was hardening.  The capsule was infilltrated with 30cc of .25% Marcaine with epinephrine.  A hemovac was place in the joint exiting superolaterally.  A pain pump was place superomedially superficial to the arthrotomy.  Once the cement was hard, the tourniquet was let down.  Hemostasis was obtained.  The arthrotomy was closed with figure-8 #1 vicryl sutures.  The deep soft tissues were closed with #0 vicryls and the subcuticular layer closed with a running #2-0 vicryl.  The skin was reapproximated and closed with skin staples.  The wound was dressed with xeroform, 4 x4's, 2 ABD sponges, a single layer of webril and a TED stocking.   The patient was then awakened, extubated, and taken to the recovery room in stable condition.  BLOOD LOSS:  300cc DRAINS: 1 hemovac, 1 pain catheter COMPLICATIONS:  None.  PLAN OF CARE: Admit to inpatient   PATIENT DISPOSITION:  PACU - hemodynamically stable.   Delay start of  Pharmacological VTE agent (>24hrs) due to surgical blood loss or risk of bleeding:  not applicable  Please fax a copy of this op note to my office at [phone] (please only include page 1 and 2 of the Case Information op note)

## 2016-10-11 ENCOUNTER — Observation Stay (HOSPITAL_COMMUNITY)
Admission: EM | Admit: 2016-10-11 | Discharge: 2016-10-12 | Disposition: A | Discharge status: Home / Self Care | Payer: Non-veteran care | Attending: Urology | Admitting: Urology

## 2016-10-11 ENCOUNTER — Encounter (HOSPITAL_COMMUNITY): Payer: Self-pay | Admitting: Emergency Medicine

## 2016-10-11 DIAGNOSIS — Z96653 Presence of artificial knee joint, bilateral: Secondary | ICD-10-CM | POA: Insufficient documentation

## 2016-10-11 DIAGNOSIS — N132 Hydronephrosis with renal and ureteral calculous obstruction: Secondary | ICD-10-CM | POA: Diagnosis not present

## 2016-10-11 DIAGNOSIS — R109 Unspecified abdominal pain: Secondary | ICD-10-CM | POA: Diagnosis present

## 2016-10-11 LAB — URINALYSIS, ROUTINE W REFLEX MICROSCOPIC
BILIRUBIN URINE: NEGATIVE
Glucose, UA: NEGATIVE mg/dL
KETONES UR: NEGATIVE mg/dL
Leukocytes, UA: NEGATIVE
Nitrite: NEGATIVE
PH: 5 (ref 5.0–8.0)
Protein, ur: 100 mg/dL — AB
SPECIFIC GRAVITY, URINE: 1.028 (ref 1.005–1.030)

## 2016-10-11 LAB — CBC
HCT: 42.8 % (ref 39.0–52.0)
HEMOGLOBIN: 14 g/dL (ref 13.0–17.0)
MCH: 26 pg (ref 26.0–34.0)
MCHC: 32.7 g/dL (ref 30.0–36.0)
MCV: 79.6 fL (ref 78.0–100.0)
PLATELETS: 316 10*3/uL (ref 150–400)
RBC: 5.38 MIL/uL (ref 4.22–5.81)
RDW: 14.1 % (ref 11.5–15.5)
WBC: 12.4 10*3/uL — AB (ref 4.0–10.5)

## 2016-10-11 LAB — BASIC METABOLIC PANEL
ANION GAP: 8 (ref 5–15)
BUN: 14 mg/dL (ref 6–20)
CHLORIDE: 105 mmol/L (ref 101–111)
CO2: 25 mmol/L (ref 22–32)
Calcium: 9.2 mg/dL (ref 8.9–10.3)
Creatinine, Ser: 1.3 mg/dL — ABNORMAL HIGH (ref 0.61–1.24)
GFR calc Af Amer: >60 mL/min (ref >60)
Glucose, Bld: 109 mg/dL — ABNORMAL HIGH (ref 65–99)
POTASSIUM: 3.7 mmol/L (ref 3.5–5.1)
SODIUM: 138 mmol/L (ref 135–145)

## 2016-10-11 MED ORDER — OXYCODONE-ACETAMINOPHEN 5-325 MG PO TABS
1.0000 | ORAL_TABLET | ORAL | Status: DC | PRN
  Administered 2016-10-11: 1 via ORAL

## 2016-10-11 MED ORDER — OXYCODONE-ACETAMINOPHEN 5-325 MG PO TABS
ORAL_TABLET | ORAL | Status: AC
  Filled 2016-10-11: qty 1

## 2016-10-11 MED ORDER — ONDANSETRON 4 MG PO TBDP
ORAL_TABLET | ORAL | Status: AC
  Filled 2016-10-11: qty 1

## 2016-10-11 MED ORDER — ONDANSETRON 4 MG PO TBDP
4.0000 mg | ORAL_TABLET | Freq: Once | ORAL | Status: AC
  Administered 2016-10-11: 4 mg via ORAL

## 2016-10-11 NOTE — ED Triage Notes (Signed)
Pt presents with sudden onset Right flank pain with N/V and feeling like having to urinate and defecate but unable to successfully do so; pt denies hx of of abd surg, kidney stones or UTI

## 2016-10-11 NOTE — ED Provider Notes (Signed)
[facility] DEPT Provider Note   CSN: [id number] Arrival date & time: 10/11/16  2131     History   Chief Complaint Chief Complaint  Patient presents with  . Flank Pain  . Emesis    HPI Dwayne Delgado is a 51 y.o. male.  Patient is a 50 year old male who presents to the emergency department with a complaint of right flank pain.  Patient states that he had flank pain approximately a week ago. He states that it went away on its own and he "toughed it out". The patient had flank pain again today. He states that conservative measures were not successful. He started having problems with nausea and then came to the emergency department. The patient denies any injury to the right flank. He's not had any operations or procedures. He's not had any blood in his urine or any recent high fevers. His been no dysuria to be reported. He presents to the emergency department for assistance with this issue.      Past Medical History:  Diagnosis Date  . Degenerative arthritis of knee, bilateral   . Obesity   . RLL pneumonia ([place]) 06/13/2014    Patient Active Problem List   Diagnosis Date Noted  . 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 Nocturia 06/13/2014  . Healthcare maintenance 05/27/2011  . Obesity, Class I, BMI 30-34.9 04/09/2010    Past Surgical History:  Procedure Laterality Date  . KNEE ARTHROSCOPY Bilateral 1993, 01/2014  . TOTAL KNEE ARTHROPLASTY Left 08/27/2015   Procedure: TOTAL KNEE ARTHROPLASTY;  Surgeon: Vickey Huger, MD;  Location: [facility];  Service: Orthopedics;  Laterality: Left;  . TOTAL KNEE ARTHROPLASTY Right 11/12/2015   Procedure: TOTAL KNEE ARTHROPLASTY;  Surgeon: Vickey Huger, MD;  Location: [facility];  Service: Orthopedics;  Laterality: Right;  . WISDOM TOOTH EXTRACTION         Home Medications    Prior to Admission medications   Medication Sig Start Date End Date Taking? Authorizing Provider  aspirin EC 325 MG EC tablet Take 1 tablet (325 mg total) by mouth 2  (two) times daily. 11/13/15   Donia Ast, PA  hydroxychloroquine (PLAQUENIL) 200 MG tablet Take 200 mg by mouth 2 (two) times daily.    [provider]  leflunomide (ARAVA) 20 MG tablet Take 20 mg by mouth daily.    [provider]  methocarbamol (ROBAXIN) 500 MG tablet Take 1-2 tablets (500-1,000 mg total) by mouth every 6 (six) hours as needed for muscle spasms. Patient taking differently: Take 500 mg by mouth at bedtime as needed for muscle spasms.  08/28/15   Donia Ast, PA  Multiple Vitamins-Minerals (MULTIVITAMIN PO) Take 1 tablet by mouth daily.    [provider]  oxyCODONE (OXY IR/ROXICODONE) 5 MG immediate release tablet Take 1-2 tablets (5-10 mg total) by mouth every 3 (three) hours as needed for breakthrough pain. 11/13/15   Donia Ast, PA  terbinafine (LAMISIL) 1 % cream Apply 1 application topically daily.    [provider]    Family History Family History  Problem Relation Age of Onset  . Coronary artery disease Father 78       MI  . Cancer Neg Hx   . Stroke Neg Hx   . Diabetes Neg Hx   . Hypertension Neg Hx     Social History Social History  Substance Use Topics  . Smoking status: Never Smoker  . Smokeless tobacco: Never Used  . Alcohol use 0.0 oz/week  Comment: Very rare     Allergies   No known allergies   Review of Systems Review of Systems  Constitutional: Negative for activity change and appetite change.  HENT: Negative for congestion, ear discharge, ear pain, facial swelling, nosebleeds, rhinorrhea, sneezing and tinnitus.   Eyes: Negative for photophobia, pain and discharge.  Respiratory: Negative for cough, choking, shortness of breath and wheezing.   Cardiovascular: Negative for chest pain, palpitations and leg swelling.  Gastrointestinal: Negative for abdominal pain, blood in stool, constipation, diarrhea, nausea and vomiting.  Genitourinary: Positive for flank pain. Negative for  difficulty urinating, dysuria, frequency and hematuria.  Musculoskeletal: Negative for back pain, gait problem, myalgias and neck pain.  Skin: Negative for color change, rash and wound.  Neurological: Negative for dizziness, seizures, syncope, facial asymmetry, speech difficulty, weakness and numbness.  Hematological: Negative for adenopathy. Does not bruise/bleed easily.  Psychiatric/Behavioral: Negative for agitation, confusion, hallucinations, self-injury and suicidal ideas. The patient is not nervous/anxious.      Physical Exam Updated Vital Signs BP (!) 157/94 (BP Location: Right Arm)   Pulse 80   Temp 98.6 F (37 C) (Oral)   Resp 18   Ht 5\' 9"  (1.753 m)   Wt 95.3 kg (210 lb)   SpO2 97%   BMI 31.01 kg/m   Physical Exam  Constitutional: Vital signs are normal. He appears well-developed and well-nourished. He is active.  HENT:  Head: Normocephalic and atraumatic.  Right Ear: Tympanic membrane, external ear and ear canal normal.  Left Ear: Tympanic membrane, external ear and ear canal normal.  Nose: Nose normal.  Mouth/Throat: Uvula is midline, oropharynx is clear and moist and mucous membranes are normal.  Eyes: Conjunctivae, EOM and lids are normal. Pupils are equal, round, and reactive to light.  Neck: Trachea normal, normal range of motion and phonation normal. Neck supple. Carotid bruit is not present.  Cardiovascular: Normal rate, regular rhythm and normal pulses.   Abdominal: Soft. Normal appearance and bowel sounds are normal. There is tenderness.  Right flank pain with change of position.  Lymphadenopathy:       Head (right side): No submental, no preauricular and no posterior auricular adenopathy present.       Head (left side): No submental, no preauricular and no posterior auricular adenopathy present.    He has no cervical adenopathy.  Neurological: He is alert. He has normal strength. No cranial nerve deficit or sensory deficit. GCS eye subscore is 4. GCS verbal  subscore is 5. GCS motor subscore is 6.  Skin: Skin is warm and dry.  Psychiatric: His speech is normal.     ED Treatments / Results  Labs (all labs ordered are listed, but only abnormal results are displayed) Labs Reviewed  URINALYSIS, ROUTINE W REFLEX MICROSCOPIC - Abnormal; Notable for the following:       Result Value   APPearance HAZY (*)    Hgb urine dipstick LARGE (*)    Protein, ur 100 (*)    Bacteria, UA FEW (*)    Squamous Epithelial / LPF 0-5 (*)    All other components within normal limits  CBC - Abnormal; Notable for the following:    WBC 12.4 (*)    All other components within normal limits  BASIC METABOLIC PANEL - Abnormal; Notable for the following:    Glucose, Bld 109 (*)    Creatinine, Ser 1.30 (*)    All other components within normal limits    EKG  EKG Interpretation None  Radiology No results found.  Procedures Procedures (including critical care time)  Medications Ordered in ED Medications  oxyCODONE-acetaminophen (PERCOCET/ROXICET) 5-325 MG per tablet 1 tablet (1 tablet Oral Given 10/11/16 2206)  ondansetron (ZOFRAN-ODT) 4 MG disintegrating tablet (not administered)  oxyCODONE-acetaminophen (PERCOCET/ROXICET) 5-325 MG per tablet (not administered)  ondansetron (ZOFRAN-ODT) disintegrating tablet 4 mg (4 mg Oral Given 10/11/16 2207)     Initial Impression / Assessment and Plan / ED Course  I have reviewed the triage vital signs and the nursing notes.  Pertinent labs & imaging results that were available during my care of the patient were reviewed by me and considered in my medical decision making (see chart for details).       Final Clinical Impressions(s) / ED Diagnoses MDM Blood pressure is elevated, otherwise vital signs within normal limits. Complete blood count shows the white blood cells to be elevated at 12,400, there is no shift to the left. The creatinine is elevated on the basic metabolic panel at 1.61, otherwise within  normal limits. The anion gap is 8. Urinalysis shows a large hemoglobin. There are too many to count red blood cells. The remainder of the urinalysis is within normal limits. There are calcium oxalate crystals also noted on the urine since.  Recheck. Pain improving after IV dilaudid. CT pending. Care to be continued [facility] and Dr Wyvonnia Dusky.   Final diagnoses:  None    New Prescriptions New Prescriptions   No medications on file     Annette Stable 10/13/16 0820    Ezequiel Essex, MD 10/13/16 [phone]

## 2016-10-12 ENCOUNTER — Encounter (HOSPITAL_COMMUNITY): Payer: Self-pay | Admitting: Oncology

## 2016-10-12 ENCOUNTER — Observation Stay (HOSPITAL_COMMUNITY): Payer: Non-veteran care | Admitting: Anesthesiology

## 2016-10-12 ENCOUNTER — Emergency Department (HOSPITAL_COMMUNITY): Payer: Non-veteran care

## 2016-10-12 ENCOUNTER — Observation Stay (HOSPITAL_COMMUNITY): Payer: Non-veteran care

## 2016-10-12 ENCOUNTER — Encounter (HOSPITAL_COMMUNITY)
Admission: EM | Disposition: A | Discharge status: Home / Self Care | Payer: Self-pay | Source: Home / Self Care | Attending: Emergency Medicine

## 2016-10-12 DIAGNOSIS — N132 Hydronephrosis with renal and ureteral calculous obstruction: Secondary | ICD-10-CM | POA: Diagnosis present

## 2016-10-12 DIAGNOSIS — Z96653 Presence of artificial knee joint, bilateral: Secondary | ICD-10-CM | POA: Diagnosis not present

## 2016-10-12 HISTORY — PX: CYSTOSCOPY W/ URETERAL STENT PLACEMENT: SHX1429

## 2016-10-12 LAB — BASIC METABOLIC PANEL
Anion gap: 7 (ref 5–15)
BUN: 22 mg/dL — ABNORMAL HIGH (ref 6–20)
CALCIUM: 8.8 mg/dL — AB (ref 8.9–10.3)
CO2: 25 mmol/L (ref 22–32)
CREATININE: 1.73 mg/dL — AB (ref 0.61–1.24)
Chloride: 111 mmol/L (ref 101–111)
GFR calc non Af Amer: 45 mL/min — ABNORMAL LOW (ref >60)
GFR, EST AFRICAN AMERICAN: 52 mL/min — AB (ref >60)
Glucose, Bld: 102 mg/dL — ABNORMAL HIGH (ref 65–99)
Potassium: 4 mmol/L (ref 3.5–5.1)
Sodium: 143 mmol/L (ref 135–145)

## 2016-10-12 LAB — SURGICAL PCR SCREEN
MRSA, PCR: NEGATIVE
Staphylococcus aureus: NEGATIVE

## 2016-10-12 LAB — HIV ANTIBODY (ROUTINE TESTING W REFLEX): HIV Screen 4th Generation wRfx: NONREACTIVE

## 2016-10-12 SURGERY — CYSTOSCOPY, WITH RETROGRADE PYELOGRAM AND URETERAL STENT INSERTION
Anesthesia: General | Site: Ureter | Laterality: Bilateral

## 2016-10-12 MED ORDER — FENTANYL CITRATE (PF) 100 MCG/2ML IJ SOLN
INTRAMUSCULAR | Status: DC | PRN
  Administered 2016-10-12 (×2): 50 ug via INTRAVENOUS

## 2016-10-12 MED ORDER — MORPHINE SULFATE (PF) 4 MG/ML IV SOLN: 2.0000 mg | INTRAVENOUS | Status: DC | PRN

## 2016-10-12 MED ORDER — HYDROMORPHONE HCL 1 MG/ML IJ SOLN: 0.5000 mg | INTRAMUSCULAR | Status: DC | PRN

## 2016-10-12 MED ORDER — SODIUM CHLORIDE 0.9 % IJ SOLN
INTRAMUSCULAR | Status: DC | PRN
  Administered 2016-10-12: 10:00:00

## 2016-10-12 MED ORDER — ONDANSETRON HCL 4 MG/2ML IJ SOLN
4.0000 mg | INTRAMUSCULAR | Status: DC | PRN
  Administered 2016-10-12: 4 mg via INTRAVENOUS

## 2016-10-12 MED ORDER — CEFAZOLIN SODIUM-DEXTROSE 2-4 GM/100ML-% IV SOLN
INTRAVENOUS | Status: AC
  Filled 2016-10-12: qty 100

## 2016-10-12 MED ORDER — STERILE WATER FOR IRRIGATION IR SOLN
Status: DC | PRN
  Administered 2016-10-12: 3000 mL

## 2016-10-12 MED ORDER — TAMSULOSIN HCL 0.4 MG PO CAPS: 0.4000 mg | ORAL_CAPSULE | Freq: Every day | ORAL | 0 refills | Status: AC

## 2016-10-12 MED ORDER — OXYCODONE-ACETAMINOPHEN 5-325 MG PO TABS: 1.0000 | ORAL_TABLET | ORAL | Status: DC | PRN

## 2016-10-12 MED ORDER — KETOROLAC TROMETHAMINE 30 MG/ML IJ SOLN
30.0000 mg | Freq: Once | INTRAMUSCULAR | Status: AC
  Administered 2016-10-12: 30 mg via INTRAVENOUS
  Filled 2016-10-12: qty 1

## 2016-10-12 MED ORDER — PROPOFOL 10 MG/ML IV BOLUS
INTRAVENOUS | Status: AC
  Filled 2016-10-12: qty 40

## 2016-10-12 MED ORDER — LACTATED RINGERS IV SOLN
INTRAVENOUS | Status: DC | PRN
  Administered 2016-10-12: 10:00:00 via INTRAVENOUS

## 2016-10-12 MED ORDER — PROPOFOL 10 MG/ML IV BOLUS
INTRAVENOUS | Status: DC | PRN
  Administered 2016-10-12: 200 mg via INTRAVENOUS

## 2016-10-12 MED ORDER — FENTANYL CITRATE (PF) 100 MCG/2ML IJ SOLN
INTRAMUSCULAR | Status: AC
  Filled 2016-10-12: qty 2

## 2016-10-12 MED ORDER — FENTANYL CITRATE (PF) 100 MCG/2ML IJ SOLN: 25.0000 ug | INTRAMUSCULAR | Status: DC | PRN

## 2016-10-12 MED ORDER — HYDROMORPHONE HCL 1 MG/ML IJ SOLN
1.0000 mg | Freq: Once | INTRAMUSCULAR | Status: AC
  Administered 2016-10-12: 1 mg via INTRAVENOUS
  Filled 2016-10-12: qty 1

## 2016-10-12 MED ORDER — SODIUM CHLORIDE 0.9 % IV SOLN: INTRAVENOUS | Status: DC

## 2016-10-12 MED ORDER — OXYCODONE HCL 5 MG PO TABS: 10.0000 mg | ORAL_TABLET | ORAL | 0 refills | Status: DC | PRN

## 2016-10-12 MED ORDER — LIDOCAINE 2% (20 MG/ML) 5 ML SYRINGE
INTRAMUSCULAR | Status: DC | PRN
  Administered 2016-10-12: 100 mg via INTRAVENOUS

## 2016-10-12 MED ORDER — DEXAMETHASONE SODIUM PHOSPHATE 10 MG/ML IJ SOLN
INTRAMUSCULAR | Status: DC | PRN
  Administered 2016-10-12: 10 mg via INTRAVENOUS

## 2016-10-12 MED ORDER — LIDOCAINE 2% (20 MG/ML) 5 ML SYRINGE
INTRAMUSCULAR | Status: AC
  Filled 2016-10-12: qty 5

## 2016-10-12 MED ORDER — DIPHENHYDRAMINE HCL 12.5 MG/5ML PO ELIX: 12.5000 mg | ORAL_SOLUTION | Freq: Four times a day (QID) | ORAL | Status: DC | PRN

## 2016-10-12 MED ORDER — SODIUM CHLORIDE 0.9 % IV SOLN
INTRAVENOUS | Status: DC
  Administered 2016-10-12: 03:00:00 via INTRAVENOUS

## 2016-10-12 MED ORDER — ONDANSETRON HCL 4 MG/2ML IJ SOLN
INTRAMUSCULAR | Status: AC
  Filled 2016-10-12: qty 2

## 2016-10-12 MED ORDER — MIDAZOLAM HCL 2 MG/2ML IJ SOLN
INTRAMUSCULAR | Status: AC
  Filled 2016-10-12: qty 2

## 2016-10-12 MED ORDER — DEXAMETHASONE SODIUM PHOSPHATE 10 MG/ML IJ SOLN
INTRAMUSCULAR | Status: AC
  Filled 2016-10-12: qty 1

## 2016-10-12 MED ORDER — PROCHLORPERAZINE EDISYLATE 5 MG/ML IJ SOLN
5.0000 mg | Freq: Once | INTRAMUSCULAR | Status: AC
  Administered 2016-10-12: 5 mg via INTRAVENOUS
  Filled 2016-10-12: qty 2

## 2016-10-12 MED ORDER — MIDAZOLAM HCL 5 MG/5ML IJ SOLN
INTRAMUSCULAR | Status: DC | PRN
  Administered 2016-10-12: 2 mg via INTRAVENOUS

## 2016-10-12 MED ORDER — CEFAZOLIN SODIUM-DEXTROSE 2-4 GM/100ML-% IV SOLN
2.0000 g | Freq: Once | INTRAVENOUS | Status: AC
  Administered 2016-10-12: 2 g via INTRAVENOUS

## 2016-10-12 MED ORDER — DIPHENHYDRAMINE HCL 50 MG/ML IJ SOLN: 12.5000 mg | Freq: Four times a day (QID) | INTRAMUSCULAR | Status: DC | PRN

## 2016-10-12 MED ORDER — SENNOSIDES-DOCUSATE SODIUM 8.6-50 MG PO TABS
1.0000 | ORAL_TABLET | Freq: Every evening | ORAL | Status: DC | PRN
  Filled 2016-10-12: qty 1

## 2016-10-12 SURGICAL SUPPLY — 26 items
BAG URO CATCHER STRL LF (MISCELLANEOUS) ×3 IMPLANT
BASKET DAKOTA 1.9FR 11X120 (BASKET) IMPLANT
BASKET LASER NITINOL 1.9FR (BASKET) IMPLANT
CATH FOLEY LATEX FREE 20FR (CATHETERS)
CATH FOLEY LF 20FR (CATHETERS) IMPLANT
CATH INTERMIT  6FR 70CM (CATHETERS) ×3 IMPLANT
CLOTH BEACON ORANGE TIMEOUT ST (SAFETY) ×3 IMPLANT
COVER SURGICAL LIGHT HANDLE (MISCELLANEOUS) ×3 IMPLANT
EXTRACTOR STONE NITINOL NGAGE (UROLOGICAL SUPPLIES) IMPLANT
FIBER LASER TRAC TIP (UROLOGICAL SUPPLIES) IMPLANT
GLOVE BIO SURGEON STRL SZ8 (GLOVE) ×3 IMPLANT
GOWN STRL REUS W/TWL XL LVL3 (GOWN DISPOSABLE) ×3 IMPLANT
GUIDEWIRE ANG ZIPWIRE 038X150 (WIRE) ×3 IMPLANT
GUIDEWIRE STR DUAL SENSOR (WIRE) ×3 IMPLANT
IV NS 1000ML (IV SOLUTION) ×2
IV NS 1000ML BAXH (IV SOLUTION) ×1 IMPLANT
MANIFOLD NEPTUNE II (INSTRUMENTS) ×3 IMPLANT
PACK CYSTO (CUSTOM PROCEDURE TRAY) ×3 IMPLANT
SHEATH ACCESS URETERAL 38CM (SHEATH) IMPLANT
STENT CONTOUR 6FRX26X.038 (STENTS) IMPLANT
STENT URET 6FRX26 CONTOUR (STENTS) ×6 IMPLANT
SYR CONTROL 10ML LL (SYRINGE) IMPLANT
SYRINGE IRR TOOMEY STRL 70CC (SYRINGE) IMPLANT
TUBE FEEDING 8FR 16IN STR KANG (MISCELLANEOUS) IMPLANT
TUBING CONNECTING 10 (TUBING) ×2 IMPLANT
TUBING CONNECTING 10' (TUBING) ×1

## 2016-10-12 NOTE — Discharge Instructions (Signed)

## 2016-10-12 NOTE — ED Notes (Signed)
[facility] called for transport. 

## 2016-10-12 NOTE — Op Note (Addendum)
Preoperative diagnosis: bilateral ureteral calculi  Postoperative diagnosis: Same  Procedure: 1 cystoscopy 2. Bilateral retrograde pyelography 3.  Intraoperative fluoroscopy, under one hour, with interpretation 4.  bilateral 6 x 26 JJ stent exchange  Attending: Nicolette Bang, MD  Resident: Lorayne Bender, MD  Anesthesia: General  Estimated blood loss: None  Drains: bilateral 6 x 26 JJ ureteral stent without tether  Specimens: none  Antibiotics: ancef  Findings: bilateral ureteral stones. Mild bilateral hydronephrosis. No masses/lesions in the bladder. Ureteral orifices in normal anatomic location.  Indications: Patient is a 51 year old male with a history of bilateral ureteral stone and elevated creatinine. After discussing treatment options, they decided proceed with bilateral stent placement.  Procedure her in detail: The patient was brought to the operating room and a brief timeout was done to ensure correct patient, correct procedure, correct site.  General anesthesia was administered patient was placed in dorsal lithotomy position.  Her genitalia was then prepped and draped in usual sterile fashion.  A rigid 23 French cystoscope was passed in the urethra and the bladder.  Bladder was inspected free masses or lesions.  the ureteral orifices were in the normal orthotopic locations. a 6 french ureteral catheter was then instilled into the left ureteral orifice.  a gentle retrograde was obtained and findings noted above. We then advanced a zipwire up to the renal pelvis. We then placed a 6 x 26 double-j ureteral stent over the zip wire. We then removed the wire and good coil was noted in the the renal pelvis under fluoroscopy and the bladder under direct vision.  We then turned out attention to the right side.  a gentle retrograde was obtained and findings noted above. We then advanced a zipwire up to the renal pelvis. we then placed a 6 x 26 double-j ureteral stent over the original zip  wire.  We then removed the wire and good coil was noted in the the renal pelvis under fluoroscopy and the bladder under direct vision.  the bladder was then drained and this concluded the procedure which was well tolerated by patient.  Complications: None  Condition: Stable, extubated, transferred to PACU  Plan: Patient is to be discharged home as to follow-up in 2 weeks for stone extraction.

## 2016-10-12 NOTE — Discharge Summary (Addendum)
Date of admission: 10/11/2016  Date of discharge: 10/12/2016  Admission diagnosis: Bilateral ureteral stones with associated AKI  Discharge diagnosis: Same  History and Physical: For full details, please see admission history and physical. Briefly, Dwayne Delgado is a 51 y.o. male with bilateral ureteral stones. After discussing management/treatment options, he  elected to proceed with surgical treatment.  Hospital Course: Woodford Strege was admitted from [facility] ED. Please see admission note for further details. He was taken to the operating room on 10/12/2016 and underwent a bilateral ureteral stent placement. He tolerated this procedure well and without complications. Postoperatively, the patient was able to be transferred to a regular hospital room following recovery from anesthesia. He met all discharge criteria and was able to be discharged home on POD#0.  Laboratory values:  Recent Labs  10/11/16 2207  HGB 14.0  HCT 42.8    Disposition: Home  Discharge instruction: No driving or operating heavy machinery while taking narcotics.   Discharge medications:  Allergies as of 10/12/2016      Reactions   No Known Allergies       Medication List    TAKE these medications   aspirin 325 MG EC tablet Take 1 tablet (325 mg total) by mouth 2 (two) times daily.   methocarbamol 500 MG tablet Commonly known as:  ROBAXIN Take 1-2 tablets (500-1,000 mg total) by mouth every 6 (six) hours as needed for muscle spasms.   oxyCODONE 5 MG immediate release tablet Commonly known as:  Oxy IR/ROXICODONE Take 2 tablets (10 mg total) by mouth every 4 (four) hours as needed for breakthrough pain. What changed:  how much to take  when to take this   tamsulosin 0.4 MG Caps capsule Commonly known as:  FLOMAX Take 1 capsule (0.4 mg total) by mouth daily after supper.       Followup: He will follow-up with Dr. Alyson Ingles for surgery in the near future.  I have seen and evaluated the patient and  agree with Corene Cornea Lomboy's assessment and plan.  Nicolette Bang

## 2016-10-12 NOTE — Progress Notes (Signed)
Pt d/c home. Post op. Able to tolerate a regular diet without N/V..... D/c instruction with RX given. Pt verbalized understanding of instructions. No changes on initial AM assessment at this time. Condition stable

## 2016-10-12 NOTE — Op Note (Deleted)
Preoperative Diagnosis: Bilateral ureteral stones  Postoperative Diagnosis:  Same  Procedure(s) Performed:   1. Cystourethroscopy 2. Bilateral ureteral catheterization with retrograde pyelograms 3. Bilateral ureteral stent placement (6 Fr x 26 cm) 4. Intraoperative fluoroscopy with interpretation <1hr  Surgeons:   Primary: Cleon Gustin, MD Assisting: Rosamaria Lints, MD   Anesthesia:  General via LMA Anesthesiologist: Lyndle Herrlich, MD CRNA: Lavina Hamman, CRNA   Fluids:  See anesthesia record  Estimated blood loss:  Minimal  Specimens:  None  Cultures:  None  Drains:  None  Implant(s): Bilateral 6Fr x 26 cm double J type ureteral stents  Complications:  None  Indications: 51 y.o. year-old male with history of flank pain noted to have bilateral ureteral stones with acute kidney injury. He was previously seen in holding where he provided written informed consent for the procedure after discussion of the risks, benefits, and alternatives. All questions answered. He is eager to proceed.  Findings:  Grossly normal cystoscopy. Successful placement of bilateral stents  Interpretation of Retrograde Pyelogram: Right hydronephrosis. No left hydronephrosis.   Description:  The patient was correctly identified in the preop holding area where written informed consent as well potential risk and complication reviewed. He agreed. They were brought back to the operative suite where a preinduction timeout was performed. Once correct information was verified, anesthesia was induced  via LMA. They were then gently placed into dorsal lithotomy position with SCDs in place for VTE prophylaxis. They were prepped and draped in the usual sterile fashion and given appropriate preoperative antibiotics with Ancef. A second timeout was then performed.   We inserted a 72F rigid cystoscope per urethra with copious lubrication and normal saline irrigation running. This demonstrated findings as  described above. We advanced a Sensor wire into the left ureteral orifice and up to the kidney under fluoroscopic guidance. We gently performed a retrograde pyelogram. We then replaced the wire and then advanced a 6 Fr x 26 cm double J type ureteral stent over the wire up to the kidney under direct and fluoroscopic guidance. Upon removal of the wire there was appropriate curls proximally and distally.  We then performed the same procedure for the right side. We advanced a Sensor wire into the right ureteral orifice and up to the kidney under fluoroscopic guidance. We gently performed a retrograde pyelogram. There was a hydronephrotic drip on this side. We then replaced the wire and then advanced a 6 Fr x 26 cm double J type ureteral stent over the wire up to the kidney under direct and fluoroscopic guidance. Upon removal of the wire there was appropriate curls proximally and distally.  The bladder was drained and cystoscope removed. The patient was awoken from anesthesia and taken to PACU in stable condition.   Post Op Plan:   1. Discharge patient from floor. 2. Will follow-up for definitive bilateral ureteroscopic stone extraction in near future.  Attestation:  Dr. Alyson Ingles was present for the entire procedure.    Joesph Fillers. Jonny Ruiz, [facility]

## 2016-10-12 NOTE — H&P (Signed)
Admission H&P  CC: Flank pain  Subjective: Dwayne Delgado is a 51 yo healthy patient presenting with one day of right flank pain. He had associated nausea and vomiting. He has had no dysuria or gross hematuria. He has not had any left flank pain. Only pain medications made the pain better, nothing makes the pain worse. He has not had pain like this before and he denies prior kidney stones. Does not have a urologist.   Past Medical History: Past Medical History:  Diagnosis Date  . Degenerative arthritis of knee, bilateral   . Obesity   . RLL pneumonia ([place]) 06/13/2014    Past Surgical History:  Past Surgical History:  Procedure Laterality Date  . KNEE ARTHROSCOPY Bilateral 1993, 01/2014  . 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 TOTAL KNEE ARTHROPLASTY Right 11/12/2015   Procedure: TOTAL KNEE ARTHROPLASTY;  Surgeon: Vickey Huger, MD;  Location: [facility];  Service: Orthopedics;  Laterality: Right;  . WISDOM TOOTH EXTRACTION      Medication: Current Facility-Administered Medications  Medication Dose Route Frequency Provider Last Rate Last Dose  . 0.9 %  sodium chloride infusion   Intravenous Continuous McKenzie, Candee Furbish, MD      . diphenhydrAMINE (BENADRYL) injection 12.5-25 mg  12.5-25 mg Intravenous Q6H PRN McKenzie, Candee Furbish, MD       Or  . diphenhydrAMINE (BENADRYL) 12.5 MG/5ML elixir 12.5-25 mg  12.5-25 mg Oral Q6H PRN McKenzie, Candee Furbish, MD      . morphine 4 MG/ML injection 2-4 mg  2-4 mg Intravenous Q2H PRN McKenzie, Candee Furbish, MD      . ondansetron (ZOFRAN) injection 4 mg  4 mg Intravenous Q4H PRN McKenzie, Candee Furbish, MD      . ondansetron (ZOFRAN-ODT) 4 MG disintegrating tablet           . oxyCODONE-acetaminophen (PERCOCET/ROXICET) 5-325 MG per tablet 1 tablet  1 tablet Oral Q30 min PRN Ezequiel Essex, MD   1 tablet at 10/11/16 2206  .  oxyCODONE-acetaminophen (PERCOCET/ROXICET) 5-325 MG per tablet 1-2 tablet  1-2 tablet Oral Q4H PRN McKenzie, Candee Furbish, MD      . oxyCODONE-acetaminophen (PERCOCET/ROXICET) 5-325 MG per tablet           . senna-docusate (Senokot-S) tablet 1 tablet  1 tablet Oral QHS PRN McKenzie, Candee Furbish, MD       Current Outpatient Prescriptions  Medication Sig Dispense Refill  . aspirin EC 325 MG EC tablet Take 1 tablet (325 mg total) by mouth 2 (two) times daily. (Patient not taking: Reported on 10/12/2016) 30 tablet 0  . methocarbamol (ROBAXIN) 500 MG tablet Take 1-2 tablets (500-1,000 mg total) by mouth every 6 (six) hours as needed for muscle spasms. (Patient not taking: Reported on 10/12/2016) 60 tablet 0  . oxyCODONE (OXY IR/ROXICODONE) 5 MG immediate release tablet Take 1-2 tablets (5-10 mg total) by mouth every 3 (three) hours as needed for breakthrough pain. (Patient not taking: Reported on 10/12/2016) 60 tablet 0    Allergies: Allergies  Allergen Reactions  . No Known Allergies     Social History: Social History  Substance Use Topics  . Smoking status: Never Smoker  . Smokeless tobacco: Never Used  . Alcohol use 0.0 oz/week     Comment: Very rare    Family History Family History  Problem Relation Age of Onset  . Coronary artery  disease Father 42       MI  . Cancer Neg Hx   . Stroke Neg Hx   . Diabetes Neg Hx   . Hypertension Neg Hx     Review of Systems 10 systems were reviewed and are negative except as noted specifically in the HPI.  Objective: Vital signs in last 24 hours: BP (!) 157/94   Pulse 75   Temp 98.6 F (37 C) (Oral)   Resp 18   Ht 5\' 9"  (1.753 m)   Wt 95.3 kg (210 lb)   SpO2 96%   BMI 31.01 kg/m   Intake/Output last 3 shifts: No intake/output data recorded.  Physical Exam General: NAD, A&O, resting, appropriate HEENT: [place]/AT, EOMI, MMM Pulmonary: Normal work of breathing on RA Cardiovascular: Regular rate, HDS, adequate peripheral perfusion Abdomen:  soft, mild right sided tenderness to palpation GU: right CVA tenderness Extremities: warm and well perfused, no edema Neuro: Appropriate, no focal neurological deficits  Most Recent Labs: Lab Results  Component Value Date   WBC 12.4 (H) 10/11/2016   HGB 14.0 10/11/2016   HCT 42.8 10/11/2016   PLT 316 10/11/2016    Lab Results  Component Value Date   NA 138 10/11/2016   K 3.7 10/11/2016   CL 105 10/11/2016   CO2 25 10/11/2016   BUN 14 10/11/2016   CREATININE 1.30 (H) 10/11/2016   CALCIUM 9.2 10/11/2016    Lab Results  Component Value Date   ALKPHOS 92 11/01/2015   BILITOT 0.5 11/01/2015   PROT 7.0 11/01/2015   ALBUMIN 3.8 11/01/2015   ALT 25 11/01/2015   AST 23 11/01/2015    Lab Results  Component Value Date   INR 0.99 11/01/2015   APTT 31 11/01/2015    IMAGING: Ct Renal Stone Study  Result Date: 10/12/2016 CLINICAL DATA:  Acute onset of right flank pain. Difficulty urinating. Initial encounter. EXAM: CT ABDOMEN AND PELVIS WITHOUT CONTRAST TECHNIQUE: Multidetector CT imaging of the abdomen and pelvis was performed following the standard protocol without IV contrast. COMPARISON:  CT of the left hip performed 07/17/2015 FINDINGS: Lower chest: The visualized lung bases are grossly clear. The visualized portions of the mediastinum are unremarkable. Hepatobiliary: The liver is unremarkable in appearance. The gallbladder is unremarkable in appearance. The common bile duct remains normal in caliber. Pancreas: The pancreas is within normal limits. Spleen: The spleen is unremarkable in appearance. Adrenals/Urinary Tract: The adrenal glands are unremarkable in appearance. There is minimal right-sided hydronephrosis, with prominence of the right ureter along its entire course. An obstructing 5 x 4 mm stone is noted at the distal right ureter, 3 cm above the right vesicoureteral junction. Mild right-sided perinephric stranding is noted. There is also a 4 mm stone at the proximal left  ureter, 7 cm below the left renal pelvis. This may reflect an intermittently obstructing stone. No left-sided hydronephrosis is seen. Nonobstructing bilateral renal stones are seen, measuring up to 4 mm in size. Stomach/Bowel: The stomach is unremarkable in appearance. The small bowel is within normal limits. The appendix is normal in caliber, without evidence of appendicitis. The colon is unremarkable in appearance. Vascular/Lymphatic: The abdominal aorta is unremarkable in appearance. The inferior vena cava is grossly unremarkable. No retroperitoneal lymphadenopathy is seen. No pelvic sidewall lymphadenopathy is identified. Reproductive: The bladder is mildly distended and grossly unremarkable in appearance. The prostate remains normal in size, with scattered calcification. Other: A small left inguinal hernia is noted, containing only fat. Musculoskeletal: No acute osseous abnormalities  are identified. The visualized musculature is unremarkable in appearance. IMPRESSION: 1. Minimal right-sided hydronephrosis, with an obstructing 5 x 4 mm stone at the distal right ureter, 3 cm above the right vesicoureteral junction. 2. 4 mm stone at the proximal left ureter, 7 cm below the left renal pelvis. This may reflect an intermittently obstructing stone. No left-sided hydronephrosis seen. The presence of bilateral ureteral stones can result in rapid onset of renal failure, though the left ureteral stone does not appear to be causing significant obstruction at this time. Urology consultation would be helpful, as deemed clinically appropriate. 3. Nonobstructing bilateral renal stones measure up to 4 mm in size. 4. Small left inguinal hernia, containing only fat. These results were called by telephone at the time of interpretation on 10/12/2016 at 2:23 am to Dr. Wyvonnia Dusky, who verbally acknowledged these results. Electronically Signed   By: Garald Balding M.D.   On: 10/12/2016 02:23     Assessment: Patient is a 51 y.o. male  presenting with right flank pain found to have bilateral ureteral stones. Little suspicion for associated sepsis. His creatinine was elevated to 1.3 from baseline of around 0.8. He was admitted from [facility] ED for management.  I discussed the diagnosis of bilateral ureteral stones and the concern of precipitating acute renal failure. I recommended bilateral ureteral stent placement, which we will do today. Procedure, risks, benefits, alternatives, complications discussed. He may be discharged later today as long as there is no concern for sepsis.   Plan: 1. OR today for cysto bilateral ureteral stent placement 2. Keep NPO for above with IV fluids 3. May discharge after OR. 4. Will require follow-up ureteroscopic stone extraction to be scheduled after discharge.  Discussed with Dr. Alyson Ingles. Thank you for this consult. Please do not hesitate to contact us with any further questions/concerns.  Lorayne Bender, MD PGY4 Urology Resident

## 2016-10-12 NOTE — Transfer of Care (Signed)
Immediate Anesthesia Transfer of Care Note  Patient: Dwayne Delgado  Procedure(s) Performed: Procedure(s): CYSTOSCOPY WITH RETROGRADE PYELOGRAM/URETERAL STENT PLACEMENT (Bilateral)  Patient Location: PACU  Anesthesia Type:General  Level of Consciousness:  sedated, patient cooperative and responds to stimulation  Airway & Oxygen Therapy:Patient Spontanous Breathing and Patient connected to face mask oxgen  Post-op Assessment:  Report given to PACU RN and Post -op Vital signs reviewed and stable  Post vital signs:  Reviewed and stable  Last Vitals:  Vitals:   10/12/16 0435 10/12/16 1039  BP: 121/78 (P) 126/84  Pulse: 63 (P) 86  Resp: 17 (P) 18  Temp: 36.5 C (P) 00.2 C    Complications: No apparent anesthesia complications

## 2016-10-12 NOTE — ED Provider Notes (Signed)
Patient found to have bilateral kidney stones. Right side is obstructing with hydronephrosis. This is the symptomatic side. Left side shows proximal stone with intermittent obstruction possible per radiology. Discussed with Dr. Radene Knee.  Urinalysis shows hematuria without infection.  Discussed with Dr. Jonny Ruiz of urology. Concern for possible renal failure bilateral stones and intermittent obstruction. Cr 1.3 from 0.8. He will admit patient directly to [facility] for surgical intervention in the morning. Dr. Jonny Ruiz requests to hold antibiotics at this time.  BP (!) 157/94   Pulse 75   Temp 98.6 F (37 C) (Oral)   Resp 18   Ht 5\' 9"  (1.753 m)   Wt 95.3 kg (210 lb)   SpO2 96%   BMI 31.01 kg/m     Ezequiel Essex, MD 10/12/16 [phone]

## 2016-10-12 NOTE — ED Notes (Signed)
O2 sat noted to drop after receiving pain medication.  O2 placed at 2L [place].  Reports pain being better.

## 2016-10-12 NOTE — Anesthesia Preprocedure Evaluation (Addendum)
Anesthesia Evaluation  Patient identified by MRN, date of birth, ID band Patient awake    Reviewed: Allergy & Precautions, H&P , Patient's Chart, lab work & pertinent test results, reviewed documented beta blocker date and time   Airway Mallampati: II  TM Distance: >3 FB Neck ROM: full    Dental no notable dental hx.    Pulmonary    Pulmonary exam normal breath sounds clear to auscultation       Cardiovascular  Rhythm:regular Rate:Normal     Neuro/Psych    GI/Hepatic   Endo/Other    Renal/GU      Musculoskeletal   Abdominal   Peds  Hematology   Anesthesia Other Findings   Reproductive/Obstetrics                             Anesthesia Physical Anesthesia Plan  ASA: II  Anesthesia Plan: General   Post-op Pain Management:    Induction: Intravenous  PONV Risk Score and Plan:   Airway Management Planned: LMA  Additional Equipment:   Intra-op Plan:   Post-operative Plan:   Informed Consent: I have reviewed the patients History and Physical, chart, labs and discussed the procedure including the risks, benefits and alternatives for the proposed anesthesia with the patient or authorized representative who has indicated his/her understanding and acceptance.   Dental Advisory Given  Plan Discussed with: CRNA and Surgeon  Anesthesia Plan Comments: ( )        Anesthesia Quick Evaluation  

## 2016-10-13 NOTE — Anesthesia Postprocedure Evaluation (Signed)
Anesthesia Post Note  Patient: Greysin Medlen  Procedure(s) Performed: Procedure(s) (LRB): CYSTOSCOPY WITH RETROGRADE PYELOGRAM/URETERAL STENT PLACEMENT (Bilateral)     Patient location during evaluation: PACU Anesthesia Type: General Level of consciousness: awake and alert Pain management: pain level controlled Vital Signs Assessment: post-procedure vital signs reviewed and stable Respiratory status: spontaneous breathing, nonlabored ventilation, respiratory function stable and patient connected to nasal cannula oxygen Cardiovascular status: blood pressure returned to baseline and stable Postop Assessment: no signs of nausea or vomiting Anesthetic complications: no    Last Vitals:  Vitals:   10/12/16 1100 10/12/16 1112  BP: 120/81 136/83  Pulse: 71 66  Resp: 20 15  Temp: 36.5 C 36.4 C    Last Pain:  Vitals:   10/12/16 0435  TempSrc: Oral  PainSc:                  Riccardo Dubin

## 2016-10-15 ENCOUNTER — Encounter (HOSPITAL_COMMUNITY): Payer: Self-pay | Admitting: Urology

## 2016-11-07 ENCOUNTER — Other Ambulatory Visit: Payer: Self-pay | Admitting: Urology

## 2016-11-10 ENCOUNTER — Encounter (HOSPITAL_BASED_OUTPATIENT_CLINIC_OR_DEPARTMENT_OTHER): Payer: Self-pay | Admitting: *Deleted

## 2016-11-10 NOTE — Progress Notes (Signed)
NPO AFTER MN.  ARRIVE AT 1030.  NEEDS HG.  MAY TAKE PAIN RX IF NEEDED AM DOS W/ SIPS OF WATER.

## 2016-11-14 ENCOUNTER — Ambulatory Visit (HOSPITAL_BASED_OUTPATIENT_CLINIC_OR_DEPARTMENT_OTHER)
Admission: RE | Admit: 2016-11-14 | Discharge: 2016-11-14 | Disposition: A | Discharge status: Home / Self Care | Payer: Non-veteran care | Source: Ambulatory Visit | Attending: Urology | Admitting: Urology

## 2016-11-14 ENCOUNTER — Ambulatory Visit (HOSPITAL_BASED_OUTPATIENT_CLINIC_OR_DEPARTMENT_OTHER): Payer: Non-veteran care | Admitting: Anesthesiology

## 2016-11-14 ENCOUNTER — Encounter (HOSPITAL_BASED_OUTPATIENT_CLINIC_OR_DEPARTMENT_OTHER)
Admission: RE | Disposition: A | Discharge status: Home / Self Care | Payer: Self-pay | Source: Ambulatory Visit | Attending: Urology

## 2016-11-14 ENCOUNTER — Encounter (HOSPITAL_BASED_OUTPATIENT_CLINIC_OR_DEPARTMENT_OTHER): Payer: Self-pay | Admitting: *Deleted

## 2016-11-14 DIAGNOSIS — G4733 Obstructive sleep apnea (adult) (pediatric): Secondary | ICD-10-CM | POA: Diagnosis not present

## 2016-11-14 DIAGNOSIS — N201 Calculus of ureter: Secondary | ICD-10-CM | POA: Insufficient documentation

## 2016-11-14 HISTORY — DX: Unspecified osteoarthritis, unspecified site: M19.90

## 2016-11-14 HISTORY — DX: Calculus of ureter: N20.1

## 2016-11-14 HISTORY — DX: Presence of dental prosthetic device (complete) (partial): Z97.2

## 2016-11-14 HISTORY — PX: CYSTOSCOPY WITH RETROGRADE PYELOGRAM, URETEROSCOPY AND STENT PLACEMENT: SHX5789

## 2016-11-14 HISTORY — DX: Nocturia: R35.1

## 2016-11-14 HISTORY — DX: Dependence on other enabling machines and devices: Z99.89

## 2016-11-14 HISTORY — DX: Obstructive sleep apnea (adult) (pediatric): G47.33

## 2016-11-14 HISTORY — DX: Calculus of kidney: N20.0

## 2016-11-14 HISTORY — PX: HOLMIUM LASER APPLICATION: SHX5852

## 2016-11-14 LAB — POCT HEMOGLOBIN-HEMACUE: HEMOGLOBIN: 13.2 g/dL (ref 13.0–17.0)

## 2016-11-14 SURGERY — CYSTOURETEROSCOPY, WITH RETROGRADE PYELOGRAM AND STENT INSERTION
Anesthesia: General | Laterality: Bilateral

## 2016-11-14 MED ORDER — FENTANYL CITRATE (PF) 100 MCG/2ML IJ SOLN
INTRAMUSCULAR | Status: AC
  Filled 2016-11-14: qty 2

## 2016-11-14 MED ORDER — ONDANSETRON HCL 4 MG/2ML IJ SOLN
INTRAMUSCULAR | Status: DC | PRN
  Administered 2016-11-14: 4 mg via INTRAVENOUS

## 2016-11-14 MED ORDER — DEXAMETHASONE SODIUM PHOSPHATE 10 MG/ML IJ SOLN
INTRAMUSCULAR | Status: AC
  Filled 2016-11-14: qty 1

## 2016-11-14 MED ORDER — LIDOCAINE 2% (20 MG/ML) 5 ML SYRINGE
INTRAMUSCULAR | Status: AC
  Filled 2016-11-14: qty 5

## 2016-11-14 MED ORDER — DEXAMETHASONE SODIUM PHOSPHATE 4 MG/ML IJ SOLN
INTRAMUSCULAR | Status: DC | PRN
  Administered 2016-11-14: 10 mg via INTRAVENOUS

## 2016-11-14 MED ORDER — KETOROLAC TROMETHAMINE 30 MG/ML IJ SOLN
INTRAMUSCULAR | Status: DC | PRN
  Administered 2016-11-14: 30 mg via INTRAVENOUS

## 2016-11-14 MED ORDER — MIDAZOLAM HCL 5 MG/5ML IJ SOLN
INTRAMUSCULAR | Status: DC | PRN
  Administered 2016-11-14: 2 mg via INTRAVENOUS

## 2016-11-14 MED ORDER — SODIUM CHLORIDE 0.9 % IV SOLN
INTRAVENOUS | Status: DC
  Administered 2016-11-14 (×2): via INTRAVENOUS
  Filled 2016-11-14: qty 1000

## 2016-11-14 MED ORDER — SULFAMETHOXAZOLE-TRIMETHOPRIM 800-160 MG PO TABS: 1.0000 | ORAL_TABLET | Freq: Two times a day (BID) | ORAL | 0 refills | Status: DC

## 2016-11-14 MED ORDER — OXYCODONE HCL 5 MG PO TABS: 10.0000 mg | ORAL_TABLET | ORAL | 0 refills | Status: DC | PRN

## 2016-11-14 MED ORDER — MEPERIDINE HCL 25 MG/ML IJ SOLN
6.2500 mg | INTRAMUSCULAR | Status: DC | PRN
  Filled 2016-11-14: qty 1

## 2016-11-14 MED ORDER — CEFTRIAXONE SODIUM 2 G IJ SOLR
INTRAMUSCULAR | Status: AC
Start: 2016-11-14 — End: ?
  Filled 2016-11-14: qty 2

## 2016-11-14 MED ORDER — PROPOFOL 10 MG/ML IV BOLUS
INTRAVENOUS | Status: AC
  Filled 2016-11-14: qty 20

## 2016-11-14 MED ORDER — LIDOCAINE 2% (20 MG/ML) 5 ML SYRINGE
INTRAMUSCULAR | Status: DC | PRN
  Administered 2016-11-14: 100 mg via INTRAVENOUS

## 2016-11-14 MED ORDER — KETOROLAC TROMETHAMINE 30 MG/ML IJ SOLN
INTRAMUSCULAR | Status: AC
  Filled 2016-11-14: qty 1

## 2016-11-14 MED ORDER — LACTATED RINGERS IV SOLN
INTRAVENOUS | Status: DC
  Administered 2016-11-14: 13:00:00 via INTRAVENOUS
  Filled 2016-11-14: qty 1000

## 2016-11-14 MED ORDER — DEXTROSE 5 % IV SOLN
INTRAVENOUS | Status: AC
  Filled 2016-11-14: qty 50

## 2016-11-14 MED ORDER — MIDAZOLAM HCL 2 MG/2ML IJ SOLN
INTRAMUSCULAR | Status: AC
  Filled 2016-11-14: qty 2

## 2016-11-14 MED ORDER — DEXTROSE 5 % IV SOLN
2.0000 g | INTRAVENOUS | Status: AC
  Administered 2016-11-14: 2 g via INTRAVENOUS
  Filled 2016-11-14: qty 2

## 2016-11-14 MED ORDER — ONDANSETRON HCL 4 MG/2ML IJ SOLN
INTRAMUSCULAR | Status: AC
  Filled 2016-11-14: qty 2

## 2016-11-14 MED ORDER — FENTANYL CITRATE (PF) 100 MCG/2ML IJ SOLN
INTRAMUSCULAR | Status: DC | PRN
  Administered 2016-11-14 (×4): 50 ug via INTRAVENOUS

## 2016-11-14 MED ORDER — METOCLOPRAMIDE HCL 5 MG/ML IJ SOLN
10.0000 mg | Freq: Once | INTRAMUSCULAR | Status: DC | PRN
  Filled 2016-11-14: qty 2

## 2016-11-14 MED ORDER — PROPOFOL 10 MG/ML IV BOLUS
INTRAVENOUS | Status: DC | PRN
  Administered 2016-11-14: 200 mg via INTRAVENOUS

## 2016-11-14 MED ORDER — FENTANYL CITRATE (PF) 100 MCG/2ML IJ SOLN
25.0000 ug | INTRAMUSCULAR | Status: DC | PRN
  Filled 2016-11-14: qty 1

## 2016-11-14 SURGICAL SUPPLY — 26 items
BAG DRAIN URO-CYSTO SKYTR STRL (DRAIN) ×3 IMPLANT
BASKET DAKOTA 1.9FR 11X120 (BASKET) IMPLANT
CATH INTERMIT  6FR 70CM (CATHETERS) IMPLANT
CLOTH BEACON ORANGE TIMEOUT ST (SAFETY) ×3 IMPLANT
EVACUATOR MICROVAS BLADDER (UROLOGICAL SUPPLIES) IMPLANT
EXTRACTOR STONE NITINOL NGAGE (UROLOGICAL SUPPLIES) ×3 IMPLANT
FIBER LASER FLEXIVA 1000 (UROLOGICAL SUPPLIES) IMPLANT
FIBER LASER FLEXIVA 200 (UROLOGICAL SUPPLIES) ×3 IMPLANT
FIBER LASER FLEXIVA 365 (UROLOGICAL SUPPLIES) IMPLANT
FIBER LASER FLEXIVA 550 (UROLOGICAL SUPPLIES) IMPLANT
FIBER LASER TRAC TIP (UROLOGICAL SUPPLIES) ×3 IMPLANT
GLOVE BIO SURGEON STRL SZ8 (GLOVE) ×3 IMPLANT
GOWN STRL REUS W/TWL LRG LVL3 (GOWN DISPOSABLE) ×3 IMPLANT
GOWN STRL REUS W/TWL XL LVL3 (GOWN DISPOSABLE) ×3 IMPLANT
GUIDEWIRE ANG ZIPWIRE 038X150 (WIRE) ×3 IMPLANT
GUIDEWIRE STR DUAL SENSOR (WIRE) IMPLANT
INFUSOR MANOMETER BAG 3000ML (MISCELLANEOUS) ×3 IMPLANT
IV NS IRRIG 3000ML ARTHROMATIC (IV SOLUTION) ×3 IMPLANT
KIT RM TURNOVER CYSTO AR (KITS) ×3 IMPLANT
MANIFOLD NEPTUNE II (INSTRUMENTS) ×3 IMPLANT
PACK CYSTO (CUSTOM PROCEDURE TRAY) ×3 IMPLANT
STENT URET 6FRX26 CONTOUR (STENTS) ×6 IMPLANT
SYRINGE 10CC LL (SYRINGE) ×3 IMPLANT
TUBE CONNECTING 12'X1/4 (SUCTIONS)
TUBE CONNECTING 12X1/4 (SUCTIONS) IMPLANT
TUBE FEEDING 8FR 16IN STR KANG (MISCELLANEOUS) IMPLANT

## 2016-11-14 NOTE — Discharge Instructions (Signed)
Ureteral Stent Implantation, Care After Refer to this sheet in the next few weeks. These instructions provide you with information about caring for yourself after your procedure. Your health care provider may also give you more specific instructions. Your treatment has been planned according to current medical practices, but problems sometimes occur. Call your health care provider if you have any problems or questions after your procedure. What can I expect after the procedure? After the procedure, it is common to have:  Nausea.  Mild pain when you urinate. You may feel this pain in your lower back or lower abdomen. Pain should stop within a few minutes after you urinate. This may last for up to 1 week.  A small amount of blood in your urine for several days.  Follow these instructions at home:  Medicines  Take over-the-counter and prescription medicines only as told by your health care provider.  If you were prescribed an antibiotic medicine, take it as told by your health care provider. Do not stop taking the antibiotic even if you start to feel better.  Do not drive for 24 hours if you received a sedative.  Do not drive or operate heavy machinery while taking prescription pain medicines. Activity  Return to your normal activities as told by your health care provider. Ask your health care provider what activities are safe for you.  Do not lift anything that is heavier than 10 lb (4.5 kg). Follow this limit for 1 week after your procedure, or for as long as told by your health care provider. General instructions  Watch for any blood in your urine. Call your health care provider if the amount of blood in your urine increases.  If you have a catheter: ? Follow instructions from your health care provider about taking care of your catheter and collection bag. ? Do not take baths, swim, or use a hot tub until your health care provider approves.  Drink enough fluid to keep your urine  clear or pale yellow.  Keep all follow-up visits as told by your health care provider. This is important. Contact a health care provider if:  You have pain that gets worse or does not get better with medicine, especially pain when you urinate.  You have difficulty urinating.  You feel nauseous or you vomit repeatedly during a period of more than 2 days after the procedure. Get help right away if:  Your urine is dark red or has blood clots in it.  You are leaking urine (have incontinence).  The end of the stent comes out of your urethra.  You cannot urinate.  You have sudden, sharp, or severe pain in your abdomen or lower back.  You have a fever. This information is not intended to replace advice given to you by your health care provider. Make sure you discuss any questions you have with your health care provider. Document Released: 12/15/2012 Document Revised: 09/20/2015 Document Reviewed: 10/27/2014 Elsevier Interactive Patient Education  2018 [place] IN 72 HOURS BY PULLING THE STRINGS  Post Anesthesia Home Care Instructions  Activity: Get plenty of rest for the remainder of the day. A responsible individual must stay with you for 24 hours following the procedure.  For the next 24 hours, DO NOT: -Drive a car -Paediatric nurse -Drink alcoholic beverages -Take any medication unless instructed by your physician -Make any legal decisions or sign important papers.  Meals: Start with liquid foods such as gelatin or soup. Progress to regular  foods as tolerated. Avoid greasy, spicy, heavy foods. If nausea and/or vomiting occur, drink only clear liquids until the nausea and/or vomiting subsides. Call your physician if vomiting continues.  Special Instructions/Symptoms: Your throat may feel dry or sore from the anesthesia or the breathing tube placed in your throat during surgery. If this causes discomfort, gargle with warm salt water. The discomfort  should disappear within 24 hours.  If you had a scopolamine patch placed behind your ear for the management of post- operative nausea and/or vomiting:  1. The medication in the patch is effective for 72 hours, after which it should be removed.  Wrap patch in a tissue and discard in the trash. Wash hands thoroughly with soap and water. 2. You may remove the patch earlier than 72 hours if you experience unpleasant side effects which may include dry mouth, dizziness or visual disturbances. 3. Avoid touching the patch. Wash your hands with soap and water after contact with the patch.

## 2016-11-14 NOTE — Op Note (Signed)
.  Preoperative diagnosis: bilateral ureteral stone  Postoperative diagnosis: Same  Procedure: 1 cystoscopy 2. bilateralretrograde pyelography 3.  Intraoperative fluoroscopy, under one hour, with interpretation 4.  Bilateral ureteroscopic stone manipulation with laser lithotripsy 5.  bilateral 6 x 26 JJ stent exchange  Attending: Rosie Fate  Anesthesia: General  Estimated blood loss: None  Drains: bilateral 6 x 26 JJ ureteral stent with tether  Specimens: stone for analysis  Antibiotics: rocephin  Findings: bilateral mid ureteral stones. no hydronephrosis. No masses/lesions in the bladder. Ureteral orifices in normal anatomic location.  Indications: Patient is a 51 year old male with a history of bilateral mid ureteral stones and elevated creatinine. He is status post bilateral stent placement. After discussing treatment options, they decided proceed with bilateral ureteroscopic stone manipulation.  Procedure her in detail: The patient was brought to the operating room and a brief timeout was done to ensure correct patient, correct procedure, correct site.  General anesthesia was administered patient was placed in dorsal lithotomy position.  Her genitalia was then prepped and draped in usual sterile fashion.  A rigid 81 French cystoscope was passed in the urethra and the bladder.  Bladder was inspected free masses or lesions.  the ureteral orifices were in the normal orthotopic locations. Using a grasper the leftt ureteral stent was brought to the urethral meatus. Through the stent a zipwire was advanced up to the renal pelvis. The stent was then removed. a 6 french ureteral catheter was then instilled into the left ureteral orifice.  a gentle retrograde was obtained and findings noted above.  we then removed the cystoscope and cannulated the left ureteral orifice with a semirigid ureteroscope.  We located the stone in the midl ureter and using a 200nm laser fiber fragmented the stone.  The fragments were removed with an Patent examiner. We then placed a 6 x 26 double-j ureteral stent over the original zip wire. We then removed the wire and good coil was noted in the the renal pelvis under fluoroscopy and the bladder under direct vision.  We then turned out attention to the right side. Using a grasper the ureteral stent was brought to the urethral meatus. Through the stent a zipwire was advanced up to the renal pelvis. The stent was then removed. a 6 french ureteral catheter was then instilled into the right ureteral orifice.  a gentle retrograde was obtained and findings noted above.  we then removed the cystoscope and cannulated the left ureteral orifice with a semirigid ureteroscope.  We located the stone in the mid ureter. Using an Ngage basket the stone was removeed.   once the stone was removed we then placed a 6 x 26 double-j ureteral stent over the original zip wire.  We then removed the wire and good coil was noted in the the renal pelvis under fluoroscopy and the bladder under direct vision. the stone fragments were then removed from the bladder and sent for analysis.   the bladder was then drained and this concluded the procedure which was well tolerated by patient.  Complications: None  Condition: Stable, extubated, transferred to PACU  Plan: Patient is to be discharged home as to follow-up in 2 weeks. He is to remove his stents by pulling the tethers in 72 hours.

## 2016-11-14 NOTE — Transfer of Care (Signed)
  Last Vitals:  Vitals:   11/14/16 1009 11/14/16 1315  BP: 134/78 128/72  Pulse: 81 87  Resp: 18 13  Temp: 36.9 C (!) 36.4 C    Last Pain:  Vitals:   11/14/16 1315  TempSrc:   PainSc: Asleep      Patients Stated Pain Goal: 8 (11/14/16 1044) Immediate Anesthesia Transfer of Care Note  Patient: Dwayne Delgado  Procedure(s) Performed: Procedure(s) (LRB): CYSTOSCOPY WITH RETROGRADE PYELOGRAM, URETEROSCOPY AND STENT PLACEMENT (Bilateral) HOLMIUM LASER APPLICATION (Bilateral)  Patient Location: PACU  Anesthesia Type: General  Level of Consciousness: awake, alert  and oriented  Airway & Oxygen Therapy: Patient Spontanous Breathing and Patient connected to nasal cannula oxygen  Post-op Assessment: Report given to PACU RN and Post -op Vital signs reviewed and stable  Post vital signs: Reviewed and stable  Complications: No apparent anesthesia complications

## 2016-11-14 NOTE — Anesthesia Procedure Notes (Signed)
Procedure Name: LMA Insertion Date/Time: 11/14/2016 12:26 PM Performed by: Denna Haggard D Pre-anesthesia Checklist: Patient identified, Emergency Drugs available, Suction available and Patient being monitored Patient Re-evaluated:Patient Re-evaluated prior to induction Oxygen Delivery Method: Circle system utilized Preoxygenation: Pre-oxygenation with 100% oxygen Induction Type: IV induction Ventilation: Mask ventilation without difficulty LMA: LMA inserted LMA Size: 5.0 Number of attempts: 1 Airway Equipment and Method: Bite block Placement Confirmation: positive ETCO2 Tube secured with: Tape Dental Injury: Teeth and Oropharynx as per pre-operative assessment

## 2016-11-14 NOTE — Anesthesia Preprocedure Evaluation (Signed)
Anesthesia Evaluation  Patient identified by MRN, date of birth, ID band Patient awake    Reviewed: Allergy & Precautions, NPO status , Patient's Chart, lab work & pertinent test results  Airway Mallampati: II  TM Distance: >3 FB Neck ROM: Full    Dental no notable dental hx. (+) Partial Lower   Pulmonary sleep apnea ,    Pulmonary exam normal breath sounds clear to auscultation       Cardiovascular negative cardio ROS Normal cardiovascular exam Rhythm:Regular Rate:Normal     Neuro/Psych negative neurological ROS  negative psych ROS   GI/Hepatic negative GI ROS, Neg liver ROS,   Endo/Other  negative endocrine ROS  Renal/GU Renal disease  negative genitourinary   Musculoskeletal negative musculoskeletal ROS (+)   Abdominal   Peds negative pediatric ROS (+)  Hematology negative hematology ROS (+)   Anesthesia Other Findings   Reproductive/Obstetrics negative OB ROS                             Anesthesia Physical Anesthesia Plan  ASA: II  Anesthesia Plan: General   Post-op Pain Management:    Induction: Intravenous  PONV Risk Score and Plan: 2 and Ondansetron and Dexamethasone  Airway Management Planned: LMA  Additional Equipment:   Intra-op Plan:   Post-operative Plan: Extubation in OR  Informed Consent: I have reviewed the patients History and Physical, chart, labs and discussed the procedure including the risks, benefits and alternatives for the proposed anesthesia with the patient or authorized representative who has indicated his/her understanding and acceptance.   Dental advisory given  Plan Discussed with: CRNA  Anesthesia Plan Comments:         Anesthesia Quick Evaluation

## 2016-11-14 NOTE — Anesthesia Postprocedure Evaluation (Signed)
Anesthesia Post Note  Patient: DEARL RUDDEN  Procedure(s) Performed: Procedure(s) (LRB): CYSTOSCOPY WITH RETROGRADE PYELOGRAM, URETEROSCOPY AND STENT PLACEMENT (Bilateral) HOLMIUM LASER APPLICATION (Bilateral)     Patient location during evaluation: PACU Anesthesia Type: General Level of consciousness: awake and alert Pain management: pain level controlled Vital Signs Assessment: post-procedure vital signs reviewed and stable Respiratory status: spontaneous breathing, nonlabored ventilation, respiratory function stable and patient connected to nasal cannula oxygen Cardiovascular status: blood pressure returned to baseline and stable Postop Assessment: no signs of nausea or vomiting Anesthetic complications: no    Last Vitals:  Vitals:   11/14/16 1359 11/14/16 1400  BP:  131/73  Pulse: 68 70  Resp: 15 12  Temp:      Last Pain:  Vitals:   11/14/16 1315  TempSrc:   PainSc: Asleep                 Jefferson Fullam P Madgie Dhaliwal

## 2016-11-14 NOTE — H&P (Signed)
Urology Admission H&P  Chief Complaint: bilateral ureteral calculi  History of Present Illness: Dwayne Delgado is a 51yo with bilateral ureteral calculi who is s/p bilateral stent placement  Past Medical History:  Diagnosis Date  . Bilateral ureteral calculi   . Nephrolithiasis    bilateral non-obstructive calculi per CT 10-12-2016  . Nocturia   . OA (osteoarthritis)   . OSA on CPAP   . Wears partial dentures    LOWER   Past Surgical History:  Procedure Laterality Date  . CYSTOSCOPY W/ URETERAL STENT PLACEMENT Bilateral 10/12/2016   Procedure: CYSTOSCOPY WITH RETROGRADE PYELOGRAM/URETERAL STENT PLACEMENT;  Surgeon: Cleon Gustin, MD;  Location: [facility];  Service: Urology;  Laterality: Bilateral;  . KNEE ARTHROSCOPY Bilateral left 1993; right 01/2014  . 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 TOTAL KNEE ARTHROPLASTY Right 11/12/2015   Procedure: TOTAL KNEE ARTHROPLASTY;  Surgeon: Vickey Huger, MD;  Location: [facility];  Service: Orthopedics;  Laterality: Right;  . WISDOM TOOTH EXTRACTION      Home Medications:  Prescriptions Prior to Admission  Medication Sig Dispense Refill Last Dose  . Multiple Vitamin (MULTIVITAMIN) tablet Take 1 tablet by mouth daily.   11/14/2016 at 0500  . oxyCODONE (OXY IR/ROXICODONE) 5 MG immediate release tablet Take 2 tablets (10 mg total) by mouth every 4 (four) hours as needed for breakthrough pain. 45 tablet 0 Past Month at Unknown time  . tamsulosin (FLOMAX) 0.4 MG CAPS capsule Take 1 capsule (0.4 mg total) by mouth daily after supper. 30 capsule 0 Past Month at Unknown time   Allergies: No Known Allergies  Family History  Problem Relation Age of Onset  . Coronary artery disease Father 14       MI  . Cancer Neg Hx   . Stroke Neg Hx   . Diabetes Neg Hx   . Hypertension Neg Hx    Social History:  reports that he has never  smoked. He has never used smokeless tobacco. He reports that he does not drink alcohol or use drugs.  Review of Systems  All other systems reviewed and are negative.   Physical Exam:  Vital signs in last 24 hours: Temp:  [98.4 F (36.9 C)] 98.4 F (36.9 C) (07/20 1009) Pulse Rate:  [81] 81 (07/20 1009) Resp:  [18] 18 (07/20 1009) BP: (134)/(78) 134/78 (07/20 1009) Weight:  [108.4 kg (239 lb)] 108.4 kg (239 lb) (07/20 1009) Physical Exam  Constitutional: He is oriented to person, place, and time. He appears well-developed and well-nourished.  HENT:  Head: Normocephalic and atraumatic.  Eyes: Pupils are equal, round, and reactive to light. EOM are normal.  Neck: Normal range of motion. No thyromegaly present.  Cardiovascular: Normal rate and regular rhythm.   Respiratory: Effort normal. No respiratory distress.  GI: Soft. He exhibits no distension.  Musculoskeletal: Normal range of motion. He exhibits no edema.  Neurological: He is alert and oriented to person, place, and time.  Skin: Skin is warm and dry.  Psychiatric: He has a normal mood and affect. His behavior is normal. Judgment and thought content normal.    Laboratory Data:  Results for orders placed or performed during the hospital encounter of 11/14/16 (from the past 24 hour(s))  Hemoglobin-hemacue, POC     Status: None   Collection Time: 11/14/16 11:32 AM  Result Value Ref Range   Hemoglobin 13.2 13.0 -  17.0 g/dL   No results found for this or any previous visit (from the past 240 hour(s)). Creatinine: No results for input(s): CREATININE in the last 168 hours. Baseline Creatinine: unknwon  Impression/Assessment:  51yo with bilateral ureteral claculi  Plan:  The risks/benefits/alternatives to bilateral URS was explained to the patient and he understands and wishes to proceed with surgery  Nicolette Bang 11/14/2016, 12:14 PM

## 2016-11-17 ENCOUNTER — Encounter (HOSPITAL_BASED_OUTPATIENT_CLINIC_OR_DEPARTMENT_OTHER): Payer: Self-pay | Admitting: Urology

## 2017-11-24 ENCOUNTER — Telehealth: Payer: Self-pay

## 2017-11-24 NOTE — Telephone Encounter (Signed)
Ok to re-establish. Thank you.

## 2017-11-24 NOTE — Telephone Encounter (Signed)
See below crm Ok to schedule re establish appointment with you or offer NP Last appointment 10/13/14 for cpx  Copied from [place] [phone]. Topic: General - Other >> Nov 24, 2017  8:47 AM Oneta Rack wrote: Relation to pt: self Call back number: [phone]   Reason for call:  Patient last seen by Dr. Danise Mina 3 yrs ago and would like to re establish, patient exp. sore throat, please advise

## 2017-11-25 NOTE — Telephone Encounter (Signed)
Left message asking pt to call office  °

## 2018-04-01 ENCOUNTER — Encounter: Payer: Self-pay | Admitting: Gastroenterology

## 2018-04-14 ENCOUNTER — Encounter: Payer: Non-veteran care | Admitting: Gastroenterology

## 2018-05-03 ENCOUNTER — Ambulatory Visit (AMBULATORY_SURGERY_CENTER): Payer: Self-pay

## 2018-05-03 VITALS — Ht 69.0 in | Wt 257.8 lb

## 2018-05-03 DIAGNOSIS — Z1211 Encounter for screening for malignant neoplasm of colon: Secondary | ICD-10-CM

## 2018-05-03 MED ORDER — PEG 3350-KCL-NA BICARB-NACL 420 G PO SOLR: 4000.0000 mL | Freq: Once | ORAL | 0 refills | Status: AC

## 2018-05-03 NOTE — Progress Notes (Signed)
Denies allergies to eggs or soy products. Denies complication of anesthesia or sedation. Denies use of weight loss medication. Denies use of O2.   Emmi instructions declined.  

## 2018-05-17 ENCOUNTER — Encounter: Payer: Self-pay | Admitting: Gastroenterology

## 2018-05-17 ENCOUNTER — Ambulatory Visit (AMBULATORY_SURGERY_CENTER): Payer: No Typology Code available for payment source | Admitting: Gastroenterology

## 2018-05-17 VITALS — BP 116/72 | HR 74 | Temp 98.4°F | Resp 12 | Ht 69.0 in | Wt 257.0 lb

## 2018-05-17 DIAGNOSIS — K635 Polyp of colon: Secondary | ICD-10-CM

## 2018-05-17 DIAGNOSIS — D123 Benign neoplasm of transverse colon: Secondary | ICD-10-CM

## 2018-05-17 DIAGNOSIS — Z1211 Encounter for screening for malignant neoplasm of colon: Secondary | ICD-10-CM

## 2018-05-17 DIAGNOSIS — D127 Benign neoplasm of rectosigmoid junction: Secondary | ICD-10-CM

## 2018-05-17 MED ORDER — SODIUM CHLORIDE 0.9 % IV SOLN: 500.0000 mL | Freq: Once | INTRAVENOUS | Status: AC

## 2018-05-17 NOTE — Patient Instructions (Signed)
Impression/Recommendations:  Polyp handout given to patient. Diverticulosis handout given to patient.  Resume previous diet. Continue present medications.  Await pathology results.  YOU HAD AN ENDOSCOPIC PROCEDURE TODAY AT [facility]:   Refer to the procedure report that was given to you for any specific questions about what was found during the examination.  If the procedure report does not answer your questions, please call your gastroenterologist to clarify.  If you requested that your care partner not be given the details of your procedure findings, then the procedure report has been included in a sealed envelope for you to review at your convenience later.  YOU SHOULD EXPECT: Some feelings of bloating in the abdomen. Passage of more gas than usual.  Walking can help get rid of the air that was put into your GI tract during the procedure and reduce the bloating. If you had a lower endoscopy (such as a colonoscopy or flexible sigmoidoscopy) you may notice spotting of blood in your stool or on the toilet paper. If you underwent a bowel prep for your procedure, you may not have a normal bowel movement for a few days.  Please Note:  You might notice some irritation and congestion in your nose or some drainage.  This is from the oxygen used during your procedure.  There is no need for concern and it should clear up in a day or so.  SYMPTOMS TO REPORT IMMEDIATELY:   Following lower endoscopy (colonoscopy or flexible sigmoidoscopy):  Excessive amounts of blood in the stool  Significant tenderness or worsening of abdominal pains  Swelling of the abdomen that is new, acute  Fever of 100F or higher For urgent or emergent issues, a gastroenterologist can be reached at any hour by calling [phone].   DIET:  We do recommend a small meal at first, but then you may proceed to your regular diet.  Drink plenty of fluids but you should avoid alcoholic beverages for 24  hours.  ACTIVITY:  You should plan to take it easy for the rest of today and you should NOT DRIVE or use heavy machinery until tomorrow (because of the sedation medicines used during the test).    FOLLOW UP: Our staff will call the number listed on your records the next business day following your procedure to check on you and address any questions or concerns that you may have regarding the information given to you following your procedure. If we do not reach you, we will leave a message.  However, if you are feeling well and you are not experiencing any problems, there is no need to return our call.  We will assume that you have returned to your regular daily activities without incident.  If any biopsies were taken you will be contacted by phone or by letter within the next 1-3 weeks.  Please call us at [phone] if you have not heard about the biopsies in 3 weeks.    SIGNATURES/CONFIDENTIALITY: You and/or your care partner have signed paperwork which will be entered into your electronic medical record.  These signatures attest to the fact that that the information above on your After Visit Summary has been reviewed and is understood.  Full responsibility of the confidentiality of this discharge information lies with you and/or your care-partner.

## 2018-05-17 NOTE — Progress Notes (Signed)
Called to room to assist during endoscopic procedure.  Patient ID and intended procedure confirmed with present staff. Received instructions for my participation in the procedure from the performing physician.  

## 2018-05-17 NOTE — Progress Notes (Signed)
Report to PACU, RN, vss, BBS= Clear.  

## 2018-05-17 NOTE — Progress Notes (Signed)
Pt's states no medical or surgical changes since previsit or office visit. 

## 2018-05-17 NOTE — Op Note (Signed)
[facility] Patient Name: Dwayne Delgado Procedure Date: 05/17/2018 10:34 AM MRN: [id number] Endoscopist: Remo Lipps P. Havery Moros , MD Age: 53 Referring MD:  Date of Birth: 1966-04-17 Gender: Male Account #: [id number] Procedure:                Colonoscopy Indications:              Screening for colorectal malignant neoplasm, This                            is the patient's first colonoscopy Medicines:                Monitored Anesthesia Care Procedure:                Pre-Anesthesia Assessment:                           - Prior to the procedure, a History and Physical                            was performed, and patient medications and                            allergies were reviewed. The patient's tolerance of                            previous anesthesia was also reviewed. The risks                            and benefits of the procedure and the sedation                            options and risks were discussed with the patient.                            All questions were answered, and informed consent                            was obtained. Prior Anticoagulants: The patient has                            taken no previous anticoagulant or antiplatelet                            agents. ASA Grade Assessment: II - A patient with                            mild systemic disease. After reviewing the risks                            and benefits, the patient was deemed in                            satisfactory condition to undergo the procedure.  After obtaining informed consent, the colonoscope                            was passed under direct vision. Throughout the                            procedure, the patient's blood pressure, pulse, and                            oxygen saturations were monitored continuously. The                            Colonoscope was introduced through the anus and                            advanced to the the  cecum, identified by                            appendiceal orifice and ileocecal valve. The                            colonoscopy was performed without difficulty. The                            patient tolerated the procedure well. The quality                            of the bowel preparation was good. The ileocecal                            valve, appendiceal orifice, and rectum were                            photographed. Scope In: 10:39:55 AM Scope Out: 10:57:20 AM Scope Withdrawal Time: 0 hours 15 minutes 23 seconds  Total Procedure Duration: 0 hours 17 minutes 25 seconds  Findings:                 The perianal and digital rectal examinations were                            normal.                           A 3 mm polyp was found in the transverse colon. The                            polyp was sessile. The polyp was removed with a                            cold snare. Resection and retrieval were complete.                           A diminutive polyp was found in the recto-sigmoid  colon. The polyp was sessile. The polyp was removed                            with a cold snare. Resection and retrieval were                            complete.                           A single medium-mouthed diverticulum was found in                            the ascending colon.                           The exam was otherwise without abnormality. Complications:            No immediate complications. Estimated blood loss:                            Minimal. Estimated Blood Loss:     Estimated blood loss was minimal. Impression:               - One 3 mm polyp in the transverse colon, removed                            with a cold snare. Resected and retrieved.                           - One diminutive polyp at the recto-sigmoid colon,                            removed with a cold snare. Resected and retrieved.                           - Diverticulosis in the  ascending colon.                           - The examination was otherwise normal. Recommendation:           - Patient has a contact number available for                            emergencies. The signs and symptoms of potential                            delayed complications were discussed with the                            patient. Return to normal activities tomorrow.                            Written discharge instructions were provided to the                            patient.                           -  Resume previous diet.                           - Continue present medications.                           - Await pathology results. Remo Lipps P. Armbruster, MD 05/17/2018 11:01:55 AM This report has been signed electronically.

## 2018-05-18 ENCOUNTER — Telehealth: Payer: Self-pay

## 2018-05-18 NOTE — Telephone Encounter (Signed)
Left message on f/u call 

## 2018-05-18 NOTE — Telephone Encounter (Signed)
No answer, left message to call back later today, B.Donnelle Rubey RN. 

## 2019-04-17 IMAGING — CT CT RENAL STONE PROTOCOL
2 of 3 series · 16 of 46 positions shown, 18 images · non-contrast
Comparison: CT of the left hip performed 07/17/2015

CLINICAL DATA: Acute onset of right flank pain. Difficulty
urinating. Initial encounter.

EXAM:
CT ABDOMEN AND PELVIS WITHOUT CONTRAST
TECHNIQUE: Multidetector CT imaging of the abdomen and pelvis was performed
following the standard protocol without IV contrast.

[Series 3: renal stone 5.0 · axial · 0.98mm/px · z∈[+722,+1167]mm · 13 of 103 slices shown, 15 images]
[im 7/103  soft-tissue]
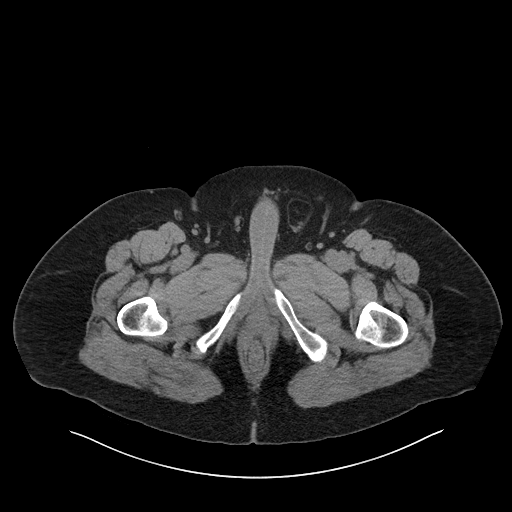
[im 7/103  bone]
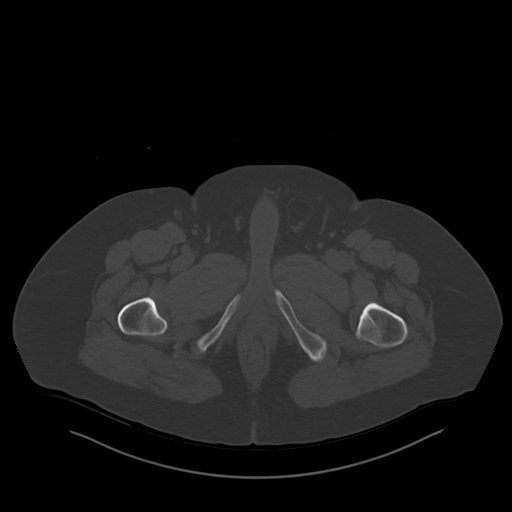
[im 14/103  soft-tissue]
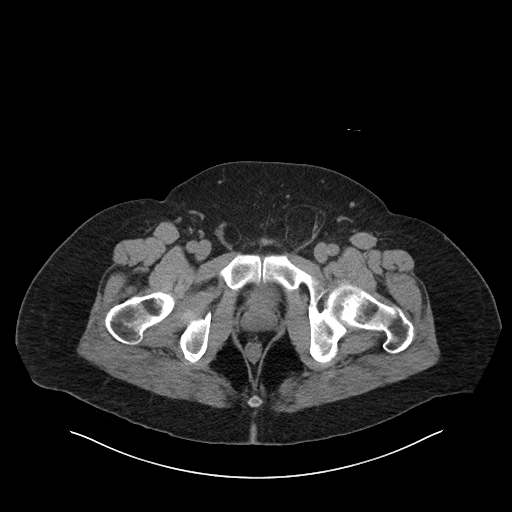
[im 20/103  soft-tissue]
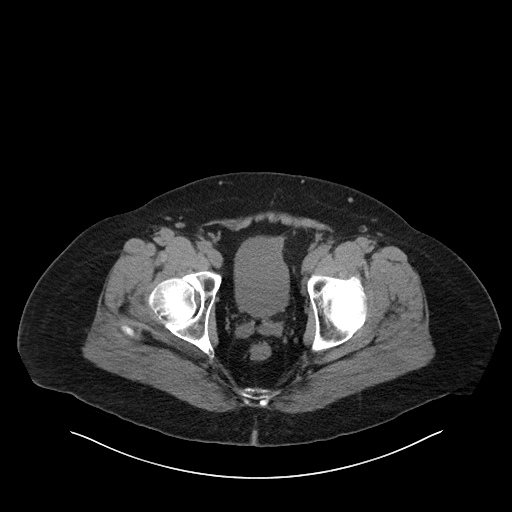
[im 30/103  soft-tissue]
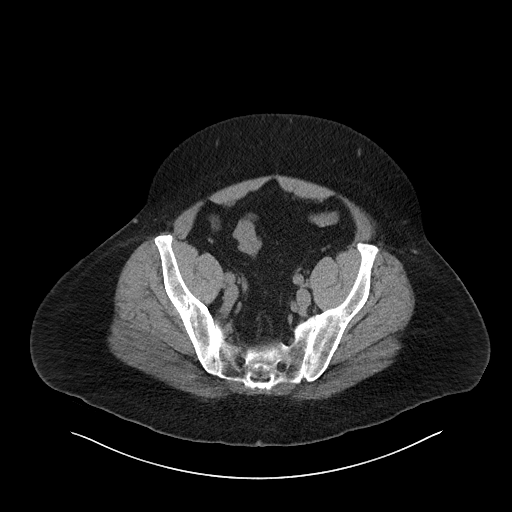
[im 37/103  soft-tissue]
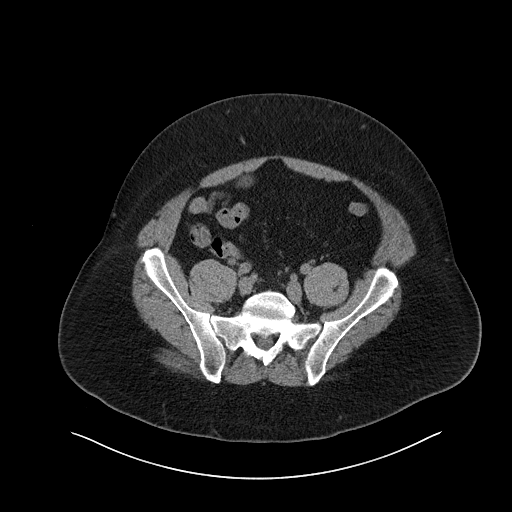
[im 43/103  soft-tissue]
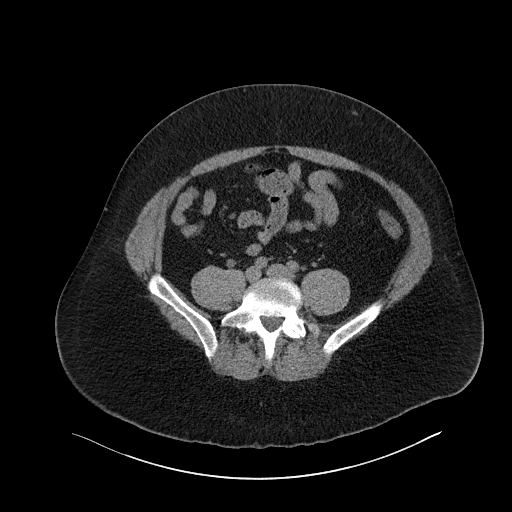
[im 53/103  soft-tissue]
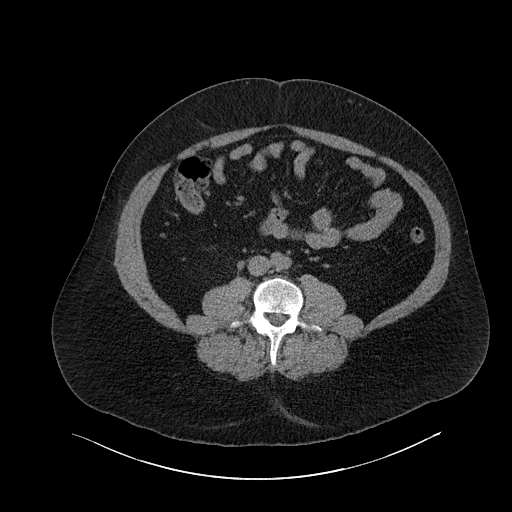
[im 60/103  soft-tissue]
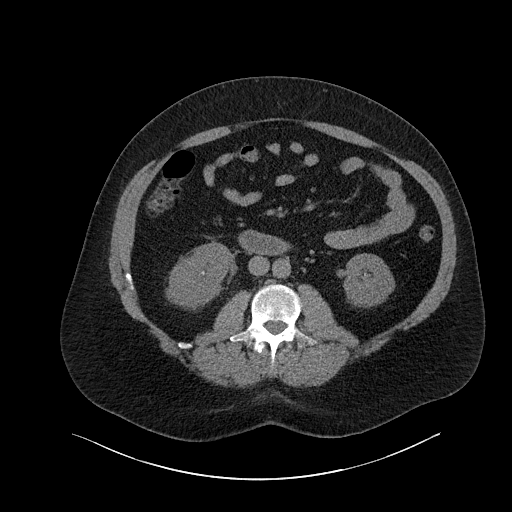
[im 66/103  soft-tissue]
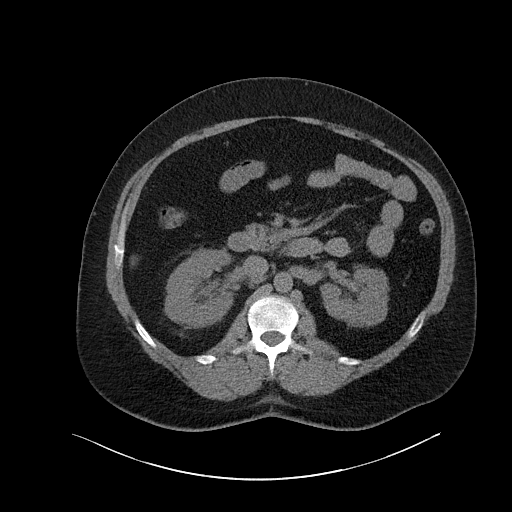
[im 66/103  bone]
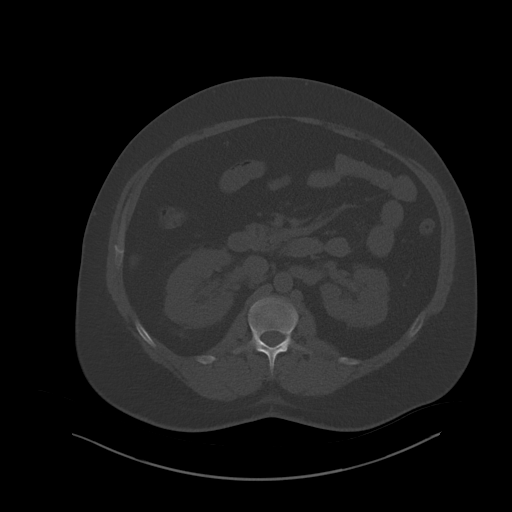
[im 73/103  soft-tissue]
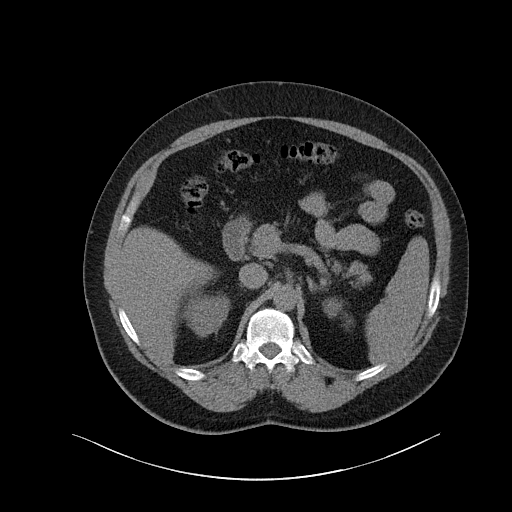
[im 83/103  soft-tissue]
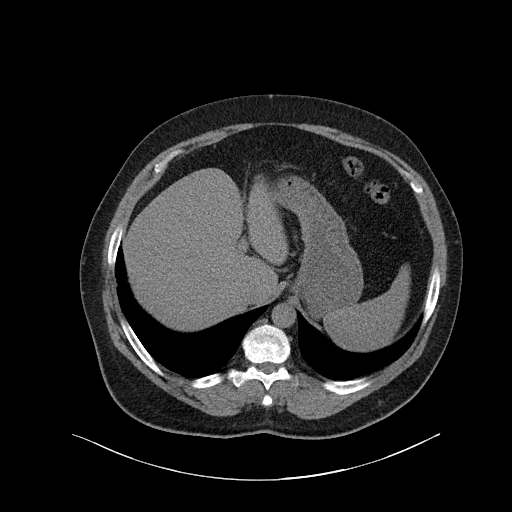
[im 89/103  soft-tissue]
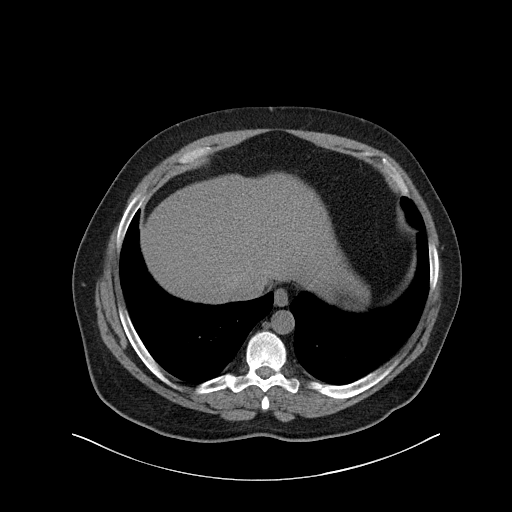
[im 96/103  soft-tissue]
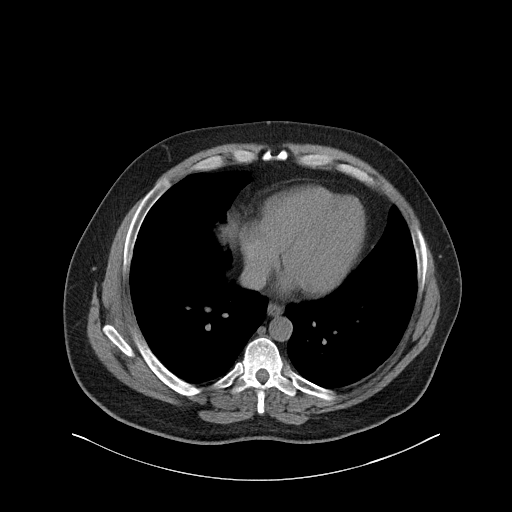

[Series 6: renal stone 3.0 cor · coronal · 1.00mm/px · 3 of 119 slices shown]
[im 40/119  soft-tissue]
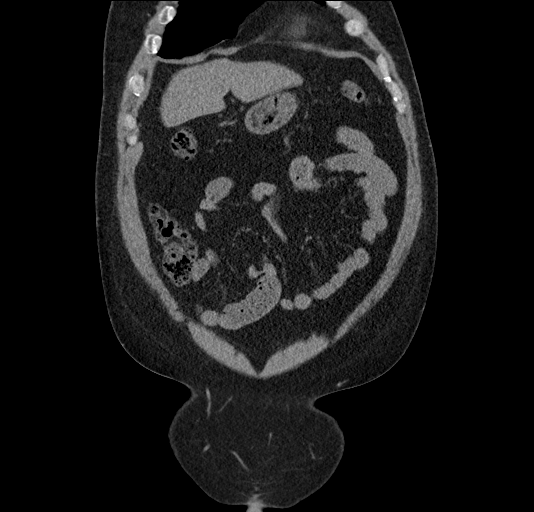
[im 53/119  soft-tissue]
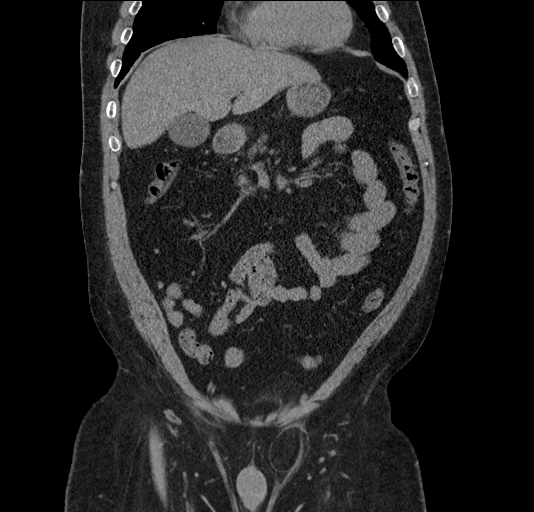
[im 66/119  soft-tissue]
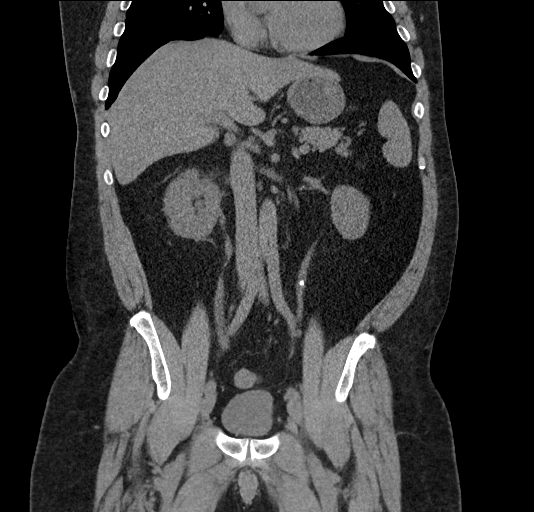

[16 of 46 positions shown; findings below may reference images not displayed]

FINDINGS: Lower chest: The visualized lung bases are grossly clear. The
visualized portions of the mediastinum are unremarkable.

Hepatobiliary: The liver is unremarkable in appearance. The
gallbladder is unremarkable in appearance. The common bile duct
remains normal in caliber.

Pancreas: The pancreas is within normal limits.

Spleen: The spleen is unremarkable in appearance.

Adrenals/Urinary Tract: The adrenal glands are unremarkable in
appearance.

There is minimal right-sided hydronephrosis, with prominence of the
right ureter along its entire course. An obstructing 5 x 4 mm stone
is noted at the distal right ureter, 3 cm above the right
vesicoureteral junction. Mild right-sided perinephric stranding is
noted.

There is also a 4 mm stone at the proximal left ureter, 7 cm below
the left renal pelvis. This may reflect an intermittently
obstructing stone. No left-sided hydronephrosis is seen.

Nonobstructing bilateral renal stones are seen, measuring up to 4 mm
in size.

Stomach/Bowel: The stomach is unremarkable in appearance. The small
bowel is within normal limits. The appendix is normal in caliber,
without evidence of appendicitis. The colon is unremarkable in
appearance.

Vascular/Lymphatic: The abdominal aorta is unremarkable in
appearance. The inferior vena cava is grossly unremarkable. No
retroperitoneal lymphadenopathy is seen. No pelvic sidewall
lymphadenopathy is identified.

Reproductive: The bladder is mildly distended and grossly
unremarkable in appearance. The prostate remains normal in size,
with scattered calcification.

Other: A small left inguinal hernia is noted, containing only fat.

Musculoskeletal: No acute osseous abnormalities are identified. The
visualized musculature is unremarkable in appearance.
IMPRESSION: 1. Minimal right-sided hydronephrosis, with an obstructing 5 x 4 mm
stone at the distal right ureter, 3 cm above the right
vesicoureteral junction.
2. 4 mm stone at the proximal left ureter, 7 cm below the left renal
pelvis. This may reflect an intermittently obstructing stone. No
left-sided hydronephrosis seen. The presence of bilateral ureteral
stones can result in rapid onset of renal failure, though the left
ureteral stone does not appear to be causing significant obstruction
at this time. Urology consultation would be helpful, as deemed
clinically appropriate.
3. Nonobstructing bilateral renal stones measure up to 4 mm in size.
4. Small left inguinal hernia, containing only fat.
These results were called by telephone at the time of interpretation
on 10/12/2016 at [DATE] to Dr. Sono, who verbally acknowledged
these results.

## 2020-06-14 ENCOUNTER — Other Ambulatory Visit: Payer: Self-pay | Admitting: Internal Medicine

## 2023-12-24 ENCOUNTER — Encounter (HOSPITAL_COMMUNITY): Payer: Self-pay | Admitting: Internal Medicine

## 2023-12-25 ENCOUNTER — Other Ambulatory Visit: Payer: Self-pay | Admitting: Internal Medicine

## 2023-12-25 DIAGNOSIS — M25561 Pain in right knee: Secondary | ICD-10-CM

## 2024-01-13 ENCOUNTER — Other Ambulatory Visit: Payer: Self-pay | Admitting: Internal Medicine

## 2024-01-13 DIAGNOSIS — M25561 Pain in right knee: Secondary | ICD-10-CM

## 2024-01-13 DIAGNOSIS — M25562 Pain in left knee: Secondary | ICD-10-CM

## 2024-01-18 ENCOUNTER — Ambulatory Visit
Admission: RE | Admit: 2024-01-18 | Discharge: 2024-01-18 | Disposition: A | Discharge status: Home / Self Care | Source: Ambulatory Visit | Attending: Internal Medicine | Admitting: Internal Medicine

## 2024-01-18 DIAGNOSIS — M25561 Pain in right knee: Secondary | ICD-10-CM | POA: Diagnosis present

## 2024-01-18 DIAGNOSIS — M25562 Pain in left knee: Secondary | ICD-10-CM | POA: Diagnosis present
